# Patient Record
Sex: Female | Born: 1990
Health system: Southern US, Community
[De-identification: ages and names within clinical notes are randomized; demographics above are authoritative.]

## PROBLEM LIST (undated history)

## (undated) DIAGNOSIS — J45909 Unspecified asthma, uncomplicated: Secondary | ICD-10-CM

## (undated) DIAGNOSIS — F79 Unspecified intellectual disabilities: Secondary | ICD-10-CM

## (undated) HISTORY — DX: Unspecified intellectual disabilities: F79

## (undated) HISTORY — PX: WISDOM TOOTH EXTRACTION: SHX21

## (undated) HISTORY — DX: Unspecified asthma, uncomplicated: J45.909

## (undated) HISTORY — PX: STRABISMUS SURGERY: SHX218

---
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
## 2018-04-08 ENCOUNTER — Telehealth: Payer: Self-pay | Admitting: Internal Medicine

## 2018-04-08 NOTE — Telephone Encounter (Signed)
Copied from CRM [id number]. Topic: Appointment Scheduling - New Patient >> Apr 08, 2018 10:32 AM Burchel, Abbi R wrote: Pt's father is a pt of Dr Okey Dupre, and is requesting to have DR Okey Dupre as pt's PCP.  Pt is special needs and would like to see the same provider as her father. Please call pt's mother to schedule if approved  Pt's Mother:[phone]

## 2018-04-09 ENCOUNTER — Encounter: Payer: Self-pay | Admitting: Family Medicine

## 2018-04-09 ENCOUNTER — Ambulatory Visit (INDEPENDENT_AMBULATORY_CARE_PROVIDER_SITE_OTHER): Payer: Medicare HMO | Admitting: Family Medicine

## 2018-04-09 VITALS — BP 118/66 | HR 92 | Ht <= 58 in | Wt 122.0 lb

## 2018-04-09 DIAGNOSIS — M25511 Pain in right shoulder: Secondary | ICD-10-CM | POA: Diagnosis not present

## 2018-04-09 DIAGNOSIS — Z23 Encounter for immunization: Secondary | ICD-10-CM | POA: Diagnosis not present

## 2018-04-09 NOTE — Assessment & Plan Note (Signed)
Likely posture related with a closed chest. Normal clinical exam. US showed a possible subacromial bursitis.  - counseled on HEP  - counseled on supportive care - if no improvement would consider PT.

## 2018-04-09 NOTE — Progress Notes (Signed)
Kelly Washington - 27 y.o. female MRN [id number]  Date of birth: 12/28/1990  SUBJECTIVE:  Including CC & ROS.  Chief Complaint  Patient presents with  . Shoulder Pain    Kelly Washington is a 27 y.o. female that is presenting with right shoulder pain. Ongoing for four days. She noticed the pain after she was swinging. Pain is located at the anterior aspect. Denies tingling or numbness. Pain is worse with abduction. Denies prior surgeries or injuries. She has not taking anything for the pain.      Review of Systems  Constitutional: Negative for fever.  HENT: Negative for congestion.   Respiratory: Negative for cough.   Cardiovascular: Negative for chest pain.  Gastrointestinal: Negative for abdominal pain.  Musculoskeletal: Negative for gait problem.  Skin: Negative for color change.  Neurological: Negative for weakness.  Hematological: Negative for adenopathy.  Psychiatric/Behavioral: Negative for agitation.    HISTORY: Past Medical, Surgical, Social, and Family History Reviewed & Updated per EMR.   Pertinent Historical Findings include:  Past Medical History:  Diagnosis Date  . Asthma   . Intellectual disability     Past Surgical History:  Procedure Laterality Date  . STRABISMUS SURGERY    . WISDOM TOOTH EXTRACTION      No Known Allergies  History reviewed. No pertinent family history.   Social History   Socioeconomic History  . Marital status: Single    Spouse name: Not on file  . Number of children: Not on file  . Years of education: Not on file  . Highest education level: Not on file  Occupational History  . Not on file  Social Needs  . Financial resource strain: Not on file  . Food insecurity:    Worry: Not on file    Inability: Not on file  . Transportation needs:    Medical: Not on file    Non-medical: Not on file  Tobacco Use  . Smoking status: Never Smoker  . Smokeless tobacco: Never Used  Substance and Sexual Activity  . Alcohol use: Never     Frequency: Never  . Drug use: Never  . Sexual activity: Not on file  Lifestyle  . Physical activity:    Days per week: Not on file    Minutes per session: Not on file  . Stress: Not on file  Relationships  . Social connections:    Talks on phone: Not on file    Gets together: Not on file    Attends religious service: Not on file    Active member of club or organization: Not on file    Attends meetings of clubs or organizations: Not on file    Relationship status: Not on file  . Intimate partner violence:    Fear of current or ex partner: Not on file    Emotionally abused: Not on file    Physically abused: Not on file    Forced sexual activity: Not on file  Other Topics Concern  . Not on file  Social History Narrative  . Not on file     PHYSICAL EXAM:  VS: BP 118/66   Pulse 92   Ht 4\' 10"  (1.473 m)   Wt 122 lb (55.3 kg)   LMP  (LMP Unknown)   SpO2 99%   BMI 25.50 kg/m  Physical Exam Gen: NAD, alert, cooperative with exam, well-appearing ENT: normal lips, normal nasal mucosa,  Eye: normal EOM, normal conjunctiva and lids CV:  no edema, +2 pedal pulses  Resp: no accessory muscle use, non-labored,  Skin: no rashes, no areas of induration  Neuro: normal tone, normal sensation to touch Psych:  normal insight, alert and oriented MSK:  Right Shoulder: Inspection reveals no abnormalities, atrophy or asymmetry. Palpation is normal with no tenderness over AC joint  ROM is full in all planes. Rotator cuff strength normal throughout. negative  Hawkin's tests, empty can sign. Speeds tests normal. negative Obrien's No apprehension sign Neurovascularly intact   Limited ultrasound: right shoulder:  Normal appearing BT  Normal appearing subscap  Possible for small subacromial bursa. Normal appearing supraspinatus   Summary: possible for small subacromial bursitis otherwise normal   Ultrasound and interpretation by Clare Gandy, MD             ASSESSMENT & PLAN:   Acute pain of right shoulder Likely posture related with a closed chest. Normal clinical exam. US showed a possible subacromial bursitis.  - counseled on HEP  - counseled on supportive care - if no improvement would consider PT.

## 2018-04-09 NOTE — Patient Instructions (Signed)
Nice to meet you  Please try to work on the posture  Please try the medicine to rub on. If this works then I can send in more  Please try the exercises  Please follow up with me if your symptoms don't improve.

## 2018-04-14 NOTE — Telephone Encounter (Signed)
Fine

## 2018-04-15 NOTE — Telephone Encounter (Signed)
LVM informing family of response

## 2018-09-07 NOTE — Telephone Encounter (Signed)
Called mother back to schedule and left message for her to call back. Okay to schedule with Dr Jonny Ruiz. OFFICE VISIT NOTE: New Patient/establish care (okay per Dr Jonny Ruiz)  Please contact office once schedule so that visit type can be changed and new patient paperwork mailed.

## 2018-09-07 NOTE — Telephone Encounter (Signed)
Patient scheduled for 2/28 with Dr. Jonny Ruiz

## 2018-09-10 ENCOUNTER — Telehealth: Payer: Self-pay

## 2018-09-10 NOTE — Telephone Encounter (Signed)
I noted in error..  This has been corrected and appointment is scheduled with Dr Okey Dupre.

## 2018-09-10 NOTE — Telephone Encounter (Signed)
fyi

## 2018-09-10 NOTE — Telephone Encounter (Signed)
Copied from CRM [id number]. Topic: General - Other >> Sep 10, 2018 10:42 AM Wyonia Hough E wrote: Reason for CRM:  PT mom called to inform office that the PT is very sensative to her weight and the amount of hair on her body. She also has no reproductive parts which is the reason for the hair and there is no need for pap smears or mention of. Pt mom states that she has meltdowns and wants office to be aware of her sensitivity and she is special needs. Pt also does not like to undress

## 2018-09-11 ENCOUNTER — Ambulatory Visit (INDEPENDENT_AMBULATORY_CARE_PROVIDER_SITE_OTHER): Payer: Medicare HMO | Admitting: Internal Medicine

## 2018-09-11 ENCOUNTER — Encounter: Payer: Self-pay | Admitting: Internal Medicine

## 2018-09-11 ENCOUNTER — Ambulatory Visit: Payer: Medicare HMO | Admitting: Internal Medicine

## 2018-09-11 DIAGNOSIS — J4520 Mild intermittent asthma, uncomplicated: Secondary | ICD-10-CM | POA: Diagnosis not present

## 2018-09-11 DIAGNOSIS — F819 Developmental disorder of scholastic skills, unspecified: Secondary | ICD-10-CM | POA: Diagnosis not present

## 2018-09-11 DIAGNOSIS — J45909 Unspecified asthma, uncomplicated: Secondary | ICD-10-CM | POA: Insufficient documentation

## 2018-09-11 DIAGNOSIS — R69 Illness, unspecified: Secondary | ICD-10-CM | POA: Diagnosis not present

## 2018-09-11 MED ORDER — ALBUTEROL SULFATE HFA 108 (90 BASE) MCG/ACT IN AERS
2.0000 | INHALATION_SPRAY | Freq: Four times a day (QID) | RESPIRATORY_TRACT | 0 refills | Status: DC | PRN
Start: 2018-09-11 — End: 2018-10-15

## 2018-09-11 NOTE — Assessment & Plan Note (Signed)
Refer to endocrinology for continued follow up. Will need to get records for workup of hypocalcemia and it is unclear if this is related to congenital abnormalities.

## 2018-09-11 NOTE — Progress Notes (Signed)
   Subjective:   Patient ID: Kelly Washington, female    DOB: Nov 21, 1990, 28 y.o.   MRN: [id number]  HPI The patient is a 28 YO female coming in new for continuation of care and concerns about getting an endocrinologist in the area (has had problems in the past with low calcium levels with muscle spasms, saw endo in [place] and had workup and was advised just to take tums TID and she has not had bad cramps since that time, does miss sometimes), and asthma (in the past, no problems with breathing in some time, has a nebulizer but not used in some time, denies having albuterol inhaler currently, no allergy problems), and intellectual delay (unclear cause exactly both without uterus and cervix, microovary, does not drive, lives with parents, not working currently, wants to start some classes at [employer] now).   PMH, [facility], social history reviewed and updated  Review of Systems  Constitutional: Negative.   HENT: Negative.   Eyes: Negative.   Respiratory: Negative for cough, chest tightness and shortness of breath.   Cardiovascular: Negative for chest pain, palpitations and leg swelling.  Gastrointestinal: Negative for abdominal distention, abdominal pain, constipation, diarrhea, nausea and vomiting.  Musculoskeletal: Negative.   Skin: Negative.   Neurological: Negative.   Psychiatric/Behavioral: Negative.     Objective:  Physical Exam Constitutional:      Appearance: She is well-developed.  HENT:     Head: Normocephalic and atraumatic.  Neck:     Musculoskeletal: Normal range of motion.  Cardiovascular:     Rate and Rhythm: Normal rate and regular rhythm.  Pulmonary:     Effort: Pulmonary effort is normal. No respiratory distress.     Breath sounds: Normal breath sounds. No wheezing or rales.  Abdominal:     General: Bowel sounds are normal. There is no distension.     Palpations: Abdomen is soft.     Tenderness: There is no abdominal tenderness. There is no rebound.   Skin:    General: Skin is warm and dry.  Neurological:     Mental Status: She is alert and oriented to person, place, and time.     Coordination: Coordination normal.     Vitals:   09/11/18 1043  BP: 120/76  Pulse: (!) 111  Temp: 97.9 F (36.6 C)  TempSrc: Oral  SpO2: 99%  Height: 4\' 10"  (1.473 m)    Assessment & Plan:

## 2018-09-11 NOTE — Addendum Note (Signed)
Addended by: Hillard Danker A on: 09/11/2018 12:24 PM   Modules accepted: Orders

## 2018-09-11 NOTE — Assessment & Plan Note (Signed)
Rx for albuterol inhaler. May have outgrown as no problems with this in some years.

## 2018-09-11 NOTE — Assessment & Plan Note (Signed)
Unclear etiology at this time. Will try to obtain records from prior provider. Could be turner syndrome with short stature and micro-ovary. Also has congenital lack of uterus and cervix.

## 2018-09-11 NOTE — Patient Instructions (Signed)
Come back in 1-2 years just to check in.   Health Maintenance, Female Adopting a healthy lifestyle and getting preventive care can go a long way to promote health and wellness. Talk with your health care provider about what schedule of regular examinations is right for you. This is a good chance for you to check in with your provider about disease prevention and staying healthy. In between checkups, there are plenty of things you can do on your own. Experts have done a lot of research about which lifestyle changes and preventive measures are most likely to keep you healthy. Ask your health care provider for more information. Weight and diet Eat a healthy diet  Be sure to include plenty of vegetables, fruits, low-fat dairy products, and lean protein.  Do not eat a lot of foods high in solid fats, added sugars, or salt.  Get regular exercise. This is one of the most important things you can do for your health. ? Most adults should exercise for at least 150 minutes each week. The exercise should increase your heart rate and make you sweat (moderate-intensity exercise). ? Most adults should also do strengthening exercises at least twice a week. This is in addition to the moderate-intensity exercise. Maintain a healthy weight  Body mass index (BMI) is a measurement that can be used to identify possible weight problems. It estimates body fat based on height and weight. Your health care provider can help determine your BMI and help you achieve or maintain a healthy weight.  For females 60 years of age and older: ? A BMI below 18.5 is considered underweight. ? A BMI of 18.5 to 24.9 is normal. ? A BMI of 25 to 29.9 is considered overweight. ? A BMI of 30 and above is considered obese. Watch levels of cholesterol and blood lipids  You should start having your blood tested for lipids and cholesterol at 28 years of age, then have this test every 5 years.  You may need to have your cholesterol levels  checked more often if: ? Your lipid or cholesterol levels are high. ? You are older than 28 years of age. ? You are at high risk for heart disease. Cancer screening Lung Cancer  Lung cancer screening is recommended for adults 6-8 years old who are at high risk for lung cancer because of a history of smoking.  A yearly low-dose CT scan of the lungs is recommended for people who: ? Currently smoke. ? Have quit within the past 15 years. ? Have at least a 30-pack-year history of smoking. A pack year is smoking an average of one pack of cigarettes a day for 1 year.  Yearly screening should continue until it has been 15 years since you quit.  Yearly screening should stop if you develop a health problem that would prevent you from having lung cancer treatment. Breast Cancer  Practice breast self-awareness. This means understanding how your breasts normally appear and feel.  It also means doing regular breast self-exams. Let your health care provider know about any changes, no matter how small.  If you are in your 20s or 30s, you should have a clinical breast exam (CBE) by a health care provider every 1-3 years as part of a regular health exam.  If you are 13 or older, have a CBE every year. Also consider having a breast X-ray (mammogram) every year.  If you have a family history of breast cancer, talk to your health care provider about genetic screening.  If you are at high risk for breast cancer, talk to your health care provider about having an MRI and a mammogram every year.  Breast cancer gene (BRCA) assessment is recommended for women who have family members with BRCA-related cancers. BRCA-related cancers include: ? Breast. ? Ovarian. ? Tubal. ? Peritoneal cancers.  Results of the assessment will determine the need for genetic counseling and BRCA1 and BRCA2 testing. Cervical Cancer Your health care provider may recommend that you be screened regularly for cancer of the pelvic  organs (ovaries, uterus, and vagina). This screening involves a pelvic examination, including checking for microscopic changes to the surface of your cervix (Pap test). You may be encouraged to have this screening done every 3 years, beginning at age 43.  For women ages 34-65, health care providers may recommend pelvic exams and Pap testing every 3 years, or they may recommend the Pap and pelvic exam, combined with testing for human papilloma virus (HPV), every 5 years. Some types of HPV increase your risk of cervical cancer. Testing for HPV may also be done on women of any age with unclear Pap test results.  Other health care providers may not recommend any screening for nonpregnant women who are considered low risk for pelvic cancer and who do not have symptoms. Ask your health care provider if a screening pelvic exam is right for you.  If you have had past treatment for cervical cancer or a condition that could lead to cancer, you need Pap tests and screening for cancer for at least 20 years after your treatment. If Pap tests have been discontinued, your risk factors (such as having a new sexual partner) need to be reassessed to determine if screening should resume. Some women have medical problems that increase the chance of getting cervical cancer. In these cases, your health care provider may recommend more frequent screening and Pap tests. Colorectal Cancer  This type of cancer can be detected and often prevented.  Routine colorectal cancer screening usually begins at 28 years of age and continues through 28 years of age.  Your health care provider may recommend screening at an earlier age if you have risk factors for colon cancer.  Your health care provider may also recommend using home test kits to check for hidden blood in the stool.  A small camera at the end of a tube can be used to examine your colon directly (sigmoidoscopy or colonoscopy). This is done to check for the earliest forms  of colorectal cancer.  Routine screening usually begins at age 74.  Direct examination of the colon should be repeated every 5-10 years through 28 years of age. However, you may need to be screened more often if early forms of precancerous polyps or small growths are found. Skin Cancer  Check your skin from head to toe regularly.  Tell your health care provider about any new moles or changes in moles, especially if there is a change in a mole's shape or color.  Also tell your health care provider if you have a mole that is larger than the size of a pencil eraser.  Always use sunscreen. Apply sunscreen liberally and repeatedly throughout the day.  Protect yourself by wearing long sleeves, pants, a wide-brimmed hat, and sunglasses whenever you are outside. Heart disease, diabetes, and high blood pressure  High blood pressure causes heart disease and increases the risk of stroke. High blood pressure is more likely to develop in: ? People who have blood pressure in the high  end of the normal range (130-139/85-89 mm Hg). ? People who are overweight or obese. ? People who are African American.  If you are 17-7 years of age, have your blood pressure checked every 3-5 years. If you are 82 years of age or older, have your blood pressure checked every year. You should have your blood pressure measured twice-once when you are at a hospital or clinic, and once when you are not at a hospital or clinic. Record the average of the two measurements. To check your blood pressure when you are not at a hospital or clinic, you can use: ? An automated blood pressure machine at a pharmacy. ? A home blood pressure monitor.  If you are between 33 years and 4 years old, ask your health care provider if you should take aspirin to prevent strokes.  Have regular diabetes screenings. This involves taking a blood sample to check your fasting blood sugar level. ? If you are at a normal weight and have a low risk for  diabetes, have this test once every three years after 28 years of age. ? If you are overweight and have a high risk for diabetes, consider being tested at a younger age or more often. Preventing infection Hepatitis B  If you have a higher risk for hepatitis B, you should be screened for this virus. You are considered at high risk for hepatitis B if: ? You were born in a country where hepatitis B is common. Ask your health care provider which countries are considered high risk. ? Your parents were born in a high-risk country, and you have not been immunized against hepatitis B (hepatitis B vaccine). ? You have HIV or AIDS. ? You use needles to inject street drugs. ? You live with someone who has hepatitis B. ? You have had sex with someone who has hepatitis B. ? You get hemodialysis treatment. ? You take certain medicines for conditions, including cancer, organ transplantation, and autoimmune conditions. Hepatitis C  Blood testing is recommended for: ? Everyone born from [birth year] through 1965. ? Anyone with known risk factors for hepatitis C. Sexually transmitted infections (STIs)  You should be screened for sexually transmitted infections (STIs) including gonorrhea and chlamydia if: ? You are sexually active and are younger than 28 years of age. ? You are older than 28 years of age and your health care provider tells you that you are at risk for this type of infection. ? Your sexual activity has changed since you were last screened and you are at an increased risk for chlamydia or gonorrhea. Ask your health care provider if you are at risk.  If you do not have HIV, but are at risk, it may be recommended that you take a prescription medicine daily to prevent HIV infection. This is called pre-exposure prophylaxis (PrEP). You are considered at risk if: ? You are sexually active and do not regularly use condoms or know the HIV status of your partner(s). ? You take drugs by injection. ? You are  sexually active with a partner who has HIV. Talk with your health care provider about whether you are at high risk of being infected with HIV. If you choose to begin PrEP, you should first be tested for HIV. You should then be tested every 3 months for as long as you are taking PrEP. Pregnancy  If you are premenopausal and you may become pregnant, ask your health care provider about preconception counseling.  If you may become pregnant,  take 400 to 800 micrograms (mcg) of folic acid every day.  If you want to prevent pregnancy, talk to your health care provider about birth control (contraception). Osteoporosis and menopause  Osteoporosis is a disease in which the bones lose minerals and strength with aging. This can result in serious bone fractures. Your risk for osteoporosis can be identified using a bone density scan.  If you are 46 years of age or older, or if you are at risk for osteoporosis and fractures, ask your health care provider if you should be screened.  Ask your health care provider whether you should take a calcium or vitamin D supplement to lower your risk for osteoporosis.  Menopause may have certain physical symptoms and risks.  Hormone replacement therapy may reduce some of these symptoms and risks. Talk to your health care provider about whether hormone replacement therapy is right for you. Follow these instructions at home:  Schedule regular health, dental, and eye exams.  Stay current with your immunizations.  Do not use any tobacco products including cigarettes, chewing tobacco, or electronic cigarettes.  If you are pregnant, do not drink alcohol.  If you are breastfeeding, limit how much and how often you drink alcohol.  Limit alcohol intake to no more than 1 drink per day for nonpregnant women. One drink equals 12 ounces of beer, 5 ounces of wine, or 1 ounces of hard liquor.  Do not use street drugs.  Do not share needles.  Ask your health care  provider for help if you need support or information about quitting drugs.  Tell your health care provider if you often feel depressed.  Tell your health care provider if you have ever been abused or do not feel safe at home. This information is not intended to replace advice given to you by your health care provider. Make sure you discuss any questions you have with your health care provider. Document Released: 01/14/2011 Document Revised: 12/07/2015 Document Reviewed: 04/04/2015 Elsevier Interactive Patient Education  2019 [employer].

## 2018-09-28 ENCOUNTER — Ambulatory Visit: Payer: Medicare HMO | Admitting: Internal Medicine

## 2018-10-15 ENCOUNTER — Other Ambulatory Visit: Payer: Self-pay | Admitting: Internal Medicine

## 2021-02-06 DIAGNOSIS — K219 Gastro-esophageal reflux disease without esophagitis: Secondary | ICD-10-CM | POA: Diagnosis not present

## 2021-02-06 DIAGNOSIS — Z833 Family history of diabetes mellitus: Secondary | ICD-10-CM | POA: Diagnosis not present

## 2021-02-06 DIAGNOSIS — Z803 Family history of malignant neoplasm of breast: Secondary | ICD-10-CM | POA: Diagnosis not present

## 2021-02-06 DIAGNOSIS — R030 Elevated blood-pressure reading, without diagnosis of hypertension: Secondary | ICD-10-CM | POA: Diagnosis not present

## 2021-02-06 DIAGNOSIS — Z8249 Family history of ischemic heart disease and other diseases of the circulatory system: Secondary | ICD-10-CM | POA: Diagnosis not present

## 2021-03-05 ENCOUNTER — Ambulatory Visit (INDEPENDENT_AMBULATORY_CARE_PROVIDER_SITE_OTHER): Payer: Medicare HMO | Admitting: Internal Medicine

## 2021-03-05 ENCOUNTER — Encounter: Payer: Self-pay | Admitting: Internal Medicine

## 2021-03-05 ENCOUNTER — Other Ambulatory Visit: Payer: Self-pay

## 2021-03-05 VITALS — BP 130/80 | HR 103 | Temp 98.1°F | Resp 18 | Ht <= 58 in | Wt 145.0 lb

## 2021-03-05 DIAGNOSIS — Z0000 Encounter for general adult medical examination without abnormal findings: Secondary | ICD-10-CM

## 2021-03-05 DIAGNOSIS — R69 Illness, unspecified: Secondary | ICD-10-CM | POA: Diagnosis not present

## 2021-03-05 DIAGNOSIS — F819 Developmental disorder of scholastic skills, unspecified: Secondary | ICD-10-CM

## 2021-03-05 DIAGNOSIS — J4520 Mild intermittent asthma, uncomplicated: Secondary | ICD-10-CM

## 2021-03-05 MED ORDER — ALBUTEROL SULFATE HFA 108 (90 BASE) MCG/ACT IN AERS: 1.0000 | INHALATION_SPRAY | RESPIRATORY_TRACT | 0 refills | Status: DC | PRN

## 2021-03-05 MED ORDER — ALBUTEROL SULFATE (2.5 MG/3ML) 0.083% IN NEBU: 2.5000 mg | INHALATION_SOLUTION | Freq: Four times a day (QID) | RESPIRATORY_TRACT | 1 refills | Status: AC | PRN

## 2021-03-05 NOTE — Patient Instructions (Signed)
Work on adding some exercise regularly.

## 2021-03-05 NOTE — Progress Notes (Signed)
Subjective:   Patient ID: Kelly Washington, female    DOB: [birth year], 30 y.o.   MRN: [id number]  HPI Here for medicare wellness and physical, no new complaints. Please see A/P for status and treatment of chronic medical problems.   Diet: heart healthy  Physical activity: sedentary Depression/mood screen: negative Hearing: intact to whispered voice Visual acuity: grossly normal, performs annual eye exam  ADLs: capable, some assist with IADLs Fall risk: none Home safety: good Cognitive evaluation: intact to orientation, naming, recall and repetition EOL planning: adv directives discussed, not in place  [employer] Visit from 03/05/2021 in [facility] at [facility] Total Score 0        No flowsheet data found.  I have personally reviewed and have noted 1. The patient's medical and social history - reviewed today no changes 2. Their use of alcohol, tobacco or illicit drugs 3. Their current medications and supplements 4. The patient's functional ability including ADL's, fall risks, home safety risks and hearing or visual impairment. 5. Diet and physical activities 6. Evidence for depression or mood disorders 7. Care team reviewed and updated 8.  The patient is not on an opioid pain medication.  Patient Care Team: Myrlene Broker, MD as PCP - General (Internal Medicine) Past Medical History:  Diagnosis Date   Asthma    Intellectual disability    Past Surgical History:  Procedure Laterality Date   STRABISMUS SURGERY     WISDOM TOOTH EXTRACTION     Family History  Problem Relation Age of Onset   Arthritis Mother    Cancer Mother    Hyperlipidemia Mother    Hypertension Mother    Miscarriages / [place] Mother    Arthritis Father    Diabetes Father    Depression Brother    Arthritis Maternal Grandmother    Cancer Maternal Grandmother    Heart disease Maternal Grandmother    Stroke Maternal Grandmother    Heart disease Maternal  Grandfather    Arthritis Maternal Grandfather    Hearing loss Maternal Grandfather      Review of Systems  Constitutional: Negative.   HENT: Negative.    Eyes: Negative.   Respiratory:  Negative for cough, chest tightness and shortness of breath.   Cardiovascular:  Negative for chest pain, palpitations and leg swelling.  Gastrointestinal:  Negative for abdominal distention, abdominal pain, constipation, diarrhea, nausea and vomiting.  Musculoskeletal: Negative.   Skin: Negative.   Neurological: Negative.   Psychiatric/Behavioral: Negative.     Objective:  Physical Exam Constitutional:      Appearance: She is well-developed.  HENT:     Head: Normocephalic and atraumatic.  Cardiovascular:     Rate and Rhythm: Normal rate and regular rhythm.  Pulmonary:     Effort: Pulmonary effort is normal. No respiratory distress.     Breath sounds: Normal breath sounds. No wheezing or rales.  Abdominal:     General: Bowel sounds are normal. There is no distension.     Palpations: Abdomen is soft.     Tenderness: There is no abdominal tenderness. There is no rebound.  Musculoskeletal:     Cervical back: Normal range of motion.  Skin:    General: Skin is warm and dry.  Neurological:     Mental Status: She is alert and oriented to person, place, and time.     Coordination: Coordination normal.    Vitals:   03/05/21 1410  BP: 130/80  Pulse: (!) 103  Resp: 18  Temp: 98.1 F (36.7 C)  TempSrc: Oral  SpO2: 98%  Weight: 145 lb (65.8 kg)  Height: 4\' 10"  (1.473 m)    This visit occurred during the SARS-CoV-2 public health emergency.  Safety protocols were in place, including screening questions prior to the visit, additional usage of staff PPE, and extensive cleaning of exam room while observing appropriate contact time as indicated for disinfecting solutions.   Assessment & Plan:

## 2021-03-07 DIAGNOSIS — Z0000 Encounter for general adult medical examination without abnormal findings: Secondary | ICD-10-CM | POA: Insufficient documentation

## 2021-03-07 NOTE — Assessment & Plan Note (Signed)
Uses albuterol prn and needs refill due to expired device. No flare today.

## 2021-03-07 NOTE — Assessment & Plan Note (Signed)
Flu shot yearly. Covid-19 up to date counseled. Tetanus due advised to get at pharmacy. Pap smear not indicated. Counseled about sun safety and mole surveillance. Counseled about the dangers of distracted driving. Given 10 year screening recommendations.

## 2021-03-07 NOTE — Assessment & Plan Note (Signed)
Stable, lives at home with parents. No job currently and not doing school currently.

## 2021-03-07 NOTE — Assessment & Plan Note (Signed)
Takes tums TID which has helped her hand cramps to stay away. Continue this.

## 2021-08-26 ENCOUNTER — Other Ambulatory Visit: Payer: Self-pay | Admitting: Internal Medicine

## 2021-08-26 ENCOUNTER — Other Ambulatory Visit: Payer: Self-pay

## 2021-08-26 ENCOUNTER — Emergency Department (HOSPITAL_BASED_OUTPATIENT_CLINIC_OR_DEPARTMENT_OTHER)
Admission: EM | Admit: 2021-08-26 | Discharge: 2021-08-26 | Disposition: A | Discharge status: Home / Self Care | Payer: Medicare HMO | Attending: Emergency Medicine | Admitting: Emergency Medicine

## 2021-08-26 ENCOUNTER — Encounter (HOSPITAL_BASED_OUTPATIENT_CLINIC_OR_DEPARTMENT_OTHER): Payer: Self-pay

## 2021-08-26 ENCOUNTER — Emergency Department (HOSPITAL_BASED_OUTPATIENT_CLINIC_OR_DEPARTMENT_OTHER): Payer: Medicare HMO

## 2021-08-26 DIAGNOSIS — R059 Cough, unspecified: Secondary | ICD-10-CM | POA: Diagnosis not present

## 2021-08-26 DIAGNOSIS — Z20822 Contact with and (suspected) exposure to covid-19: Secondary | ICD-10-CM | POA: Insufficient documentation

## 2021-08-26 DIAGNOSIS — E876 Hypokalemia: Secondary | ICD-10-CM | POA: Diagnosis not present

## 2021-08-26 DIAGNOSIS — J45909 Unspecified asthma, uncomplicated: Secondary | ICD-10-CM | POA: Diagnosis not present

## 2021-08-26 DIAGNOSIS — J4531 Mild persistent asthma with (acute) exacerbation: Secondary | ICD-10-CM

## 2021-08-26 DIAGNOSIS — R000 Tachycardia, unspecified: Secondary | ICD-10-CM | POA: Diagnosis not present

## 2021-08-26 DIAGNOSIS — R058 Other specified cough: Secondary | ICD-10-CM

## 2021-08-26 DIAGNOSIS — J4541 Moderate persistent asthma with (acute) exacerbation: Secondary | ICD-10-CM | POA: Diagnosis not present

## 2021-08-26 LAB — COMPREHENSIVE METABOLIC PANEL
ALT: 13 U/L (ref 0–44)
AST: 11 U/L — ABNORMAL LOW (ref 15–41)
Albumin: 4.5 g/dL (ref 3.5–5.0)
Alkaline Phosphatase: 39 U/L (ref 38–126)
Anion gap: 11 (ref 5–15)
BUN: 14 mg/dL (ref 6–20)
CO2: 24 mmol/L (ref 22–32)
Calcium: 9.4 mg/dL (ref 8.9–10.3)
Chloride: 103 mmol/L (ref 98–111)
Creatinine, Ser: 0.84 mg/dL (ref 0.44–1.00)
GFR, Estimated: >60 mL/min (ref >60)
Glucose, Bld: 156 mg/dL — ABNORMAL HIGH (ref 70–99)
Potassium: 3.4 mmol/L — ABNORMAL LOW (ref 3.5–5.1)
Sodium: 138 mmol/L (ref 135–145)
Total Bilirubin: 0.2 mg/dL — ABNORMAL LOW (ref 0.3–1.2)
Total Protein: 7 g/dL (ref 6.5–8.1)

## 2021-08-26 LAB — CBC
HCT: 41 % (ref 36.0–46.0)
Hemoglobin: 13.5 g/dL (ref 12.0–15.0)
MCH: 28 pg (ref 26.0–34.0)
MCHC: 32.9 g/dL (ref 30.0–36.0)
MCV: 85.1 fL (ref 80.0–100.0)
Platelets: 195 10*3/uL (ref 150–400)
RBC: 4.82 MIL/uL (ref 3.87–5.11)
RDW: 13.2 % (ref 11.5–15.5)
WBC: 7.3 10*3/uL (ref 4.0–10.5)
nRBC: 0 % (ref 0.0–0.2)

## 2021-08-26 LAB — RESP PANEL BY RT-PCR (FLU A&B, COVID) ARPGX2
Influenza A by PCR: NEGATIVE
Influenza B by PCR: NEGATIVE
SARS Coronavirus 2 by RT PCR: NEGATIVE

## 2021-08-26 LAB — TSH: TSH: 4.935 u[IU]/mL — ABNORMAL HIGH (ref 0.350–4.500)

## 2021-08-26 LAB — T4, FREE: Free T4: 0.7 ng/dL (ref 0.61–1.12)

## 2021-08-26 MED ORDER — ALBUTEROL SULFATE (2.5 MG/3ML) 0.083% IN NEBU
2.5000 mg | INHALATION_SOLUTION | Freq: Once | RESPIRATORY_TRACT | Status: AC
  Administered 2021-08-26: 2.5 mg via RESPIRATORY_TRACT
  Filled 2021-08-26: qty 3

## 2021-08-26 MED ORDER — ALBUTEROL SULFATE (2.5 MG/3ML) 0.083% IN NEBU: 2.5000 mg | INHALATION_SOLUTION | Freq: Four times a day (QID) | RESPIRATORY_TRACT | 1 refills | Status: AC | PRN

## 2021-08-26 MED ORDER — POTASSIUM CHLORIDE CRYS ER 20 MEQ PO TBCR
40.0000 meq | EXTENDED_RELEASE_TABLET | Freq: Once | ORAL | Status: DC
  Filled 2021-08-26: qty 2

## 2021-08-26 MED ORDER — ALBUTEROL SULFATE HFA 108 (90 BASE) MCG/ACT IN AERS
INHALATION_SPRAY | RESPIRATORY_TRACT | Status: AC
  Administered 2021-08-26: 2 via RESPIRATORY_TRACT
  Filled 2021-08-26: qty 6.7

## 2021-08-26 MED ORDER — IPRATROPIUM-ALBUTEROL 0.5-2.5 (3) MG/3ML IN SOLN
3.0000 mL | Freq: Once | RESPIRATORY_TRACT | Status: AC
  Administered 2021-08-26: 3 mL via RESPIRATORY_TRACT
  Filled 2021-08-26: qty 3

## 2021-08-26 MED ORDER — ALBUTEROL SULFATE (2.5 MG/3ML) 0.083% IN NEBU: 5.0000 mg | INHALATION_SOLUTION | Freq: Once | RESPIRATORY_TRACT | Status: DC

## 2021-08-26 MED ORDER — AEROCHAMBER PLUS FLO-VU MISC
1.0000 | Freq: Once | Status: AC
  Administered 2021-08-26: 1
  Filled 2021-08-26: qty 1

## 2021-08-26 MED ORDER — ALBUTEROL SULFATE HFA 108 (90 BASE) MCG/ACT IN AERS: 2.0000 | INHALATION_SPRAY | Freq: Four times a day (QID) | RESPIRATORY_TRACT | Status: DC

## 2021-08-26 MED ORDER — POTASSIUM CHLORIDE 20 MEQ PO PACK
20.0000 meq | PACK | Freq: Once | ORAL | Status: AC
  Administered 2021-08-26: 20 meq via ORAL
  Filled 2021-08-26: qty 1

## 2021-08-26 MED ORDER — IPRATROPIUM BROMIDE 0.02 % IN SOLN: 0.5000 mg | Freq: Once | RESPIRATORY_TRACT | Status: DC

## 2021-08-26 MED ORDER — PREDNISOLONE 15 MG/5ML PO SOLN: 45.0000 mg | Freq: Every day | ORAL | 0 refills | Status: AC

## 2021-08-26 MED ORDER — PREDNISONE 50 MG PO TABS
60.0000 mg | ORAL_TABLET | Freq: Once | ORAL | Status: AC
Start: 2021-08-26 — End: 2021-08-26
  Administered 2021-08-26: 60 mg via ORAL
  Filled 2021-08-26: qty 1

## 2021-08-26 NOTE — Discharge Instructions (Addendum)
It was our pleasure to provide your ER care today - we hope that you feel better.  Take orapred as prescribed. Use albuterol treatment as need.   From today's labs, your potassium level is slightly low - eat plenty of fruits and vegetables, and follow up with primary care doctor.   Follow up with primary care doctor in one week.  Return to ER if worse, new symptoms, increased trouble breathing, or other concern.   Your thyroid tests remain pending and should be resulted by tomorrow - you may call for results or check MyChart (and your doctor can f/u on results).

## 2021-08-26 NOTE — ED Triage Notes (Signed)
Patient here POV from Home with Cough.  Caregivers endorse Patient has Asthma and has been using Nebulizer Treatments more frequently the past few months.  Cough is Dry.   NAD noted during Triage. Actively Coughing.

## 2021-08-26 NOTE — ED Notes (Signed)
Discharge instructions discussed with caregivers at bedside. Pt/caregivers verbalized understanding with no questions at this time.

## 2021-08-26 NOTE — ED Provider Notes (Signed)
[facility] EMERGENCY DEPT Provider Note   CSN: [id number] Arrival date & time: 08/26/21  1731     History  Chief Complaint  Patient presents with   Cough   Asthma    Kelly Washington is a 31 y.o. female.  Pt with hx asthma, presents with increased non prod cough in the past few days. Caregivers note more neb txs that baseline over course of past few months.   No hx admission for asthma, no recent steroid use. No known ill contacts or known covid or flu exposure. Cough is non productive. No sore throat or runny nose. No chest pain. No sob. No abd pain or vomiting. No extremity swelling or pain. No recent new meds/change in meds. No wt change. No sweats or fevers.  Pt did have two neb txs today pta.   The history is provided by the patient, a caregiver and medical records. The history is limited by the condition of the patient.      Home Medications Prior to Admission medications   Medication Sig Start Date End Date Taking? Authorizing Provider  albuterol (PROVENTIL) (2.5 MG/3ML) 0.083% nebulizer solution Take 3 mLs (2.5 mg total) by nebulization every 6 (six) hours as needed for wheezing or shortness of breath. 03/05/21   Myrlene Broker, MD  albuterol (VENTOLIN HFA) 108 (90 Base) MCG/ACT inhaler Inhale 1-2 puffs into the lungs every 4 (four) hours as needed for wheezing or shortness of breath. 03/05/21   Myrlene Broker, MD  calcium-vitamin D (OSCAL WITH D) 250-125 MG-UNIT tablet Take 1 tablet by mouth daily.    [provider]      Allergies    Patient has no known allergies.    Review of Systems   Review of Systems  Constitutional:  Negative for chills, diaphoresis, fever and unexpected weight change.  HENT:  Negative for sore throat and trouble swallowing.   Eyes:  Negative for redness.  Respiratory:  Positive for cough. Negative for shortness of breath.   Cardiovascular:  Negative for chest pain and leg swelling.  Gastrointestinal:   Negative for abdominal pain, diarrhea and vomiting.  Genitourinary:  Negative for dysuria and flank pain.  Musculoskeletal:  Negative for back pain and neck pain.  Skin:  Negative for rash.  Neurological:  Negative for headaches.  Hematological:  Does not bruise/bleed easily.  Psychiatric/Behavioral:  Negative for behavioral problems.    Physical Exam Updated Vital Signs BP (!) 129/99    Pulse (!) 114    Temp 98.2 F (36.8 C) (Oral)    Resp (!) 27    Ht 1.473 m (4\' 10" )    Wt 65.8 kg    SpO2 97%    BMI 30.32 kg/m  Physical Exam Vitals and nursing note reviewed.  Constitutional:      Appearance: Normal appearance. She is well-developed.  HENT:     Head: Atraumatic.     Nose: Nose normal.     Mouth/Throat:     Mouth: Mucous membranes are moist.  Eyes:     General: No scleral icterus.    Conjunctiva/sclera: Conjunctivae normal.     Pupils: Pupils are equal, round, and reactive to light.  Neck:     Trachea: No tracheal deviation.     Comments: Thyroid not grossly enlarged or tender. Trachea midline.  Cardiovascular:     Rate and Rhythm: Regular rhythm. Tachycardia present.     Pulses: Normal pulses.     Heart sounds: Normal heart sounds. No  murmur heard.   No friction rub. No gallop.  Pulmonary:     Effort: Pulmonary effort is normal. No respiratory distress.     Breath sounds: Wheezing present.     Comments: +non-prod cough.  Abdominal:     General: Bowel sounds are normal. There is no distension.     Palpations: Abdomen is soft. There is no mass.     Tenderness: There is no abdominal tenderness.  Genitourinary:    Comments: No cva tenderness.  Musculoskeletal:        General: No swelling or tenderness.     Cervical back: Normal range of motion and neck supple. No rigidity. No muscular tenderness.     Right lower leg: No edema.     Left lower leg: No edema.  Skin:    General: Skin is warm and dry.     Findings: No rash.  Neurological:     Mental Status: She is alert.      Comments: Alert, speech normal, no dysarthria. Very pleasant, smiling, responds to questions. Motor/sens grossly intact bil.   Psychiatric:        Mood and Affect: Mood normal.    ED Results / Procedures / Treatments   Labs (all labs ordered are listed, but only abnormal results are displayed) Results for orders placed or performed during the hospital encounter of 08/26/21  Resp Panel by RT-PCR (Flu A&B, Covid) Nasopharyngeal Swab   Specimen: Nasopharyngeal Swab; Nasopharyngeal(NP) swabs in vial transport medium  Result Value Ref Range   SARS Coronavirus 2 by RT PCR NEGATIVE NEGATIVE   Influenza A by PCR NEGATIVE NEGATIVE   Influenza B by PCR NEGATIVE NEGATIVE  Comprehensive metabolic panel  Result Value Ref Range   Sodium 138 135 - 145 mmol/L   Potassium 3.4 (L) 3.5 - 5.1 mmol/L   Chloride 103 98 - 111 mmol/L   CO2 24 22 - 32 mmol/L   Glucose, Bld 156 (H) 70 - 99 mg/dL   BUN 14 6 - 20 mg/dL   Creatinine, Ser 4.97 0.44 - 1.00 mg/dL   Calcium 9.4 8.9 - 02.6 mg/dL   Total Protein 7.0 6.5 - 8.1 g/dL   Albumin 4.5 3.5 - 5.0 g/dL   AST 11 (L) 15 - 41 U/L   ALT 13 0 - 44 U/L   Alkaline Phosphatase 39 38 - 126 U/L   Total Bilirubin 0.2 (L) 0.3 - 1.2 mg/dL   GFR, Estimated >37 >85 mL/min   Anion gap 11 5 - 15  CBC  Result Value Ref Range   WBC 7.3 4.0 - 10.5 K/uL   RBC 4.82 3.87 - 5.11 MIL/uL   Hemoglobin 13.5 12.0 - 15.0 g/dL   HCT 88.5 02.7 - 74.1 %   MCV 85.1 80.0 - 100.0 fL   MCH 28.0 26.0 - 34.0 pg   MCHC 32.9 30.0 - 36.0 g/dL   RDW 28.7 86.7 - 67.2 %   Platelets 195 150 - 400 K/uL   nRBC 0.0 0.0 - 0.2 %   DG Chest Port 1 View  Result Date: 08/26/2021 CLINICAL DATA:  Cough. EXAM: PORTABLE CHEST 1 VIEW COMPARISON:  None. FINDINGS: Normal heart, mediastinum and hila. Clear lungs.  No pleural effusion or pneumothorax. Skeletal structures are grossly intact. IMPRESSION: No active disease. Electronically Signed   By: Amie Portland M.D.   On: 08/26/2021 18:16    EKG EKG  Interpretation  Date/Time:  Sunday August 26 2021 18:20:11 EST Ventricular Rate:  131 PR Interval:  127 QRS Duration: 77 QT Interval:  289 QTC Calculation: 427 R Axis:   69 Text Interpretation: Sinus tachycardia No previous tracing Confirmed by Cathren Laine ([id number]) on 08/26/2021 6:21:47 PM  Radiology DG Chest Port 1 View  Result Date: 08/26/2021 CLINICAL DATA:  Cough. EXAM: PORTABLE CHEST 1 VIEW COMPARISON:  None. FINDINGS: Normal heart, mediastinum and hila. Clear lungs.  No pleural effusion or pneumothorax. Skeletal structures are grossly intact. IMPRESSION: No active disease. Electronically Signed   By: Amie Portland M.D.   On: 08/26/2021 18:16    Procedures Procedures    Medications Ordered in ED Medications  predniSONE (DELTASONE) tablet 60 mg (has no administration in time range)  potassium chloride SA (KLOR-CON M) CR tablet 40 mEq (has no administration in time range)  albuterol (PROVENTIL) (2.5 MG/3ML) 0.083% nebulizer solution 5 mg (has no administration in time range)  ipratropium (ATROVENT) nebulizer solution 0.5 mg (has no administration in time range)    ED Course/ Medical Decision Making/ A&P                           Medical Decision Making Problems Addressed: Hypokalemia: acute illness or injury    Details: mild Mild persistent asthma with exacerbation: acute illness or injury with systemic symptoms Nonproductive cough: acute illness or injury  Amount and/or Complexity of Data Reviewed Independent Historian: caregiver External Data Reviewed: notes. Labs: ordered. Decision-making details documented in ED Course. Radiology: ordered and independent interpretation performed. Decision-making details documented in ED Course. ECG/medicine tests: ordered and independent interpretation performed. Decision-making details documented in ED Course.  Risk Prescription drug management. Decision regarding hospitalization.   Continuous pulse ox and cardiac  monitoring. Stat labs and imaging. Oral steroid, labs and imaging. Considered disposition including possible need for admission given asthma/wheezing - will give tx and reassess.   Reviewed nursing notes and prior charts for additional history.  Additional hx from caregivers.   Labs reviewed/interpreted by me - covid neg. Hgb normal. Wbc normal.   CXR reviewed/interpreted by me - no pna.   Cardiac monitor: sinus tachycardia, hr 130.   Recheck hr 110.   Albuterol and atrovent neb. Prednisone po. Kcl po.  Recheck, breathing comfortably, good air movement, no acute distress. Sats 99%.           Final Clinical Impression(s) / ED Diagnoses Final diagnoses:  None    Rx / DC Orders ED Discharge Orders     None         Cathren Laine, MD 08/26/21 2027

## 2021-11-04 ENCOUNTER — Other Ambulatory Visit: Payer: Self-pay | Admitting: Internal Medicine

## 2022-03-06 ENCOUNTER — Encounter: Payer: Self-pay | Admitting: Internal Medicine

## 2022-03-06 ENCOUNTER — Ambulatory Visit (INDEPENDENT_AMBULATORY_CARE_PROVIDER_SITE_OTHER): Payer: Medicare HMO | Admitting: Internal Medicine

## 2022-03-06 VITALS — BP 130/80 | HR 112 | Ht <= 58 in | Wt 133.4 lb

## 2022-03-06 DIAGNOSIS — Z0000 Encounter for general adult medical examination without abnormal findings: Secondary | ICD-10-CM | POA: Diagnosis not present

## 2022-03-06 DIAGNOSIS — F819 Developmental disorder of scholastic skills, unspecified: Secondary | ICD-10-CM

## 2022-03-06 DIAGNOSIS — J4520 Mild intermittent asthma, uncomplicated: Secondary | ICD-10-CM

## 2022-03-06 DIAGNOSIS — Z23 Encounter for immunization: Secondary | ICD-10-CM | POA: Diagnosis not present

## 2022-03-06 NOTE — Progress Notes (Unsigned)
   Subjective:   Patient ID: Kelly Washington, female    DOB: [birth year], 31 y.o.   MRN: [id number]  HPI The patient is here for physical.  PMH, [facility], social history reviewed and updated  Review of Systems  Constitutional: Negative.   HENT: Negative.    Eyes: Negative.   Respiratory:  Negative for cough, chest tightness and shortness of breath.   Cardiovascular:  Negative for chest pain, palpitations and leg swelling.  Gastrointestinal:  Negative for abdominal distention, abdominal pain, constipation, diarrhea, nausea and vomiting.  Musculoskeletal: Negative.   Skin: Negative.   Neurological: Negative.   Psychiatric/Behavioral: Negative.      Objective:  Physical Exam Constitutional:      Appearance: She is well-developed.  HENT:     Head: Normocephalic and atraumatic.  Cardiovascular:     Rate and Rhythm: Normal rate and regular rhythm.  Pulmonary:     Effort: Pulmonary effort is normal. No respiratory distress.     Breath sounds: Normal breath sounds. No wheezing or rales.  Abdominal:     General: Bowel sounds are normal. There is no distension.     Palpations: Abdomen is soft.     Tenderness: There is no abdominal tenderness. There is no rebound.  Musculoskeletal:     Cervical back: Normal range of motion.  Skin:    General: Skin is warm and dry.  Neurological:     Mental Status: She is alert and oriented to person, place, and time.     Coordination: Coordination normal.     Vitals:   03/06/22 1057  BP: 130/80  Pulse: (!) 112  SpO2: 99%  Weight: 133 lb 6 oz (60.5 kg)  Height: 4\' 10"  (1.473 m)    Assessment & Plan:  Flu shot given at visit

## 2022-03-06 NOTE — Assessment & Plan Note (Addendum)
gug

## 2022-03-07 MED ORDER — ALBUTEROL SULFATE HFA 108 (90 BASE) MCG/ACT IN AERS: 1.0000 | INHALATION_SPRAY | RESPIRATORY_TRACT | 6 refills | Status: AC | PRN

## 2022-03-07 NOTE — Assessment & Plan Note (Signed)
Flu shot given. Covid-19 counseled. Tetanus up to date. Pap smear not indicated. Counseled about sun safety and mole surveillance. Counseled about the dangers of distracted driving. Given 10 year screening recommendations.

## 2022-03-07 NOTE — Assessment & Plan Note (Signed)
Stable, living with parents and not working or in school at this time. Is trying to be more social and leave house more often in future. Once weather cools some encouraged to do exercise.

## 2022-03-16 IMAGING — DX DG CHEST 1V PORT
1 series · 1 of 1 positions shown · non-contrast
Comparison: None.

CLINICAL DATA: Cough.

EXAM:
PORTABLE CHEST 1 VIEW

[chest ap]
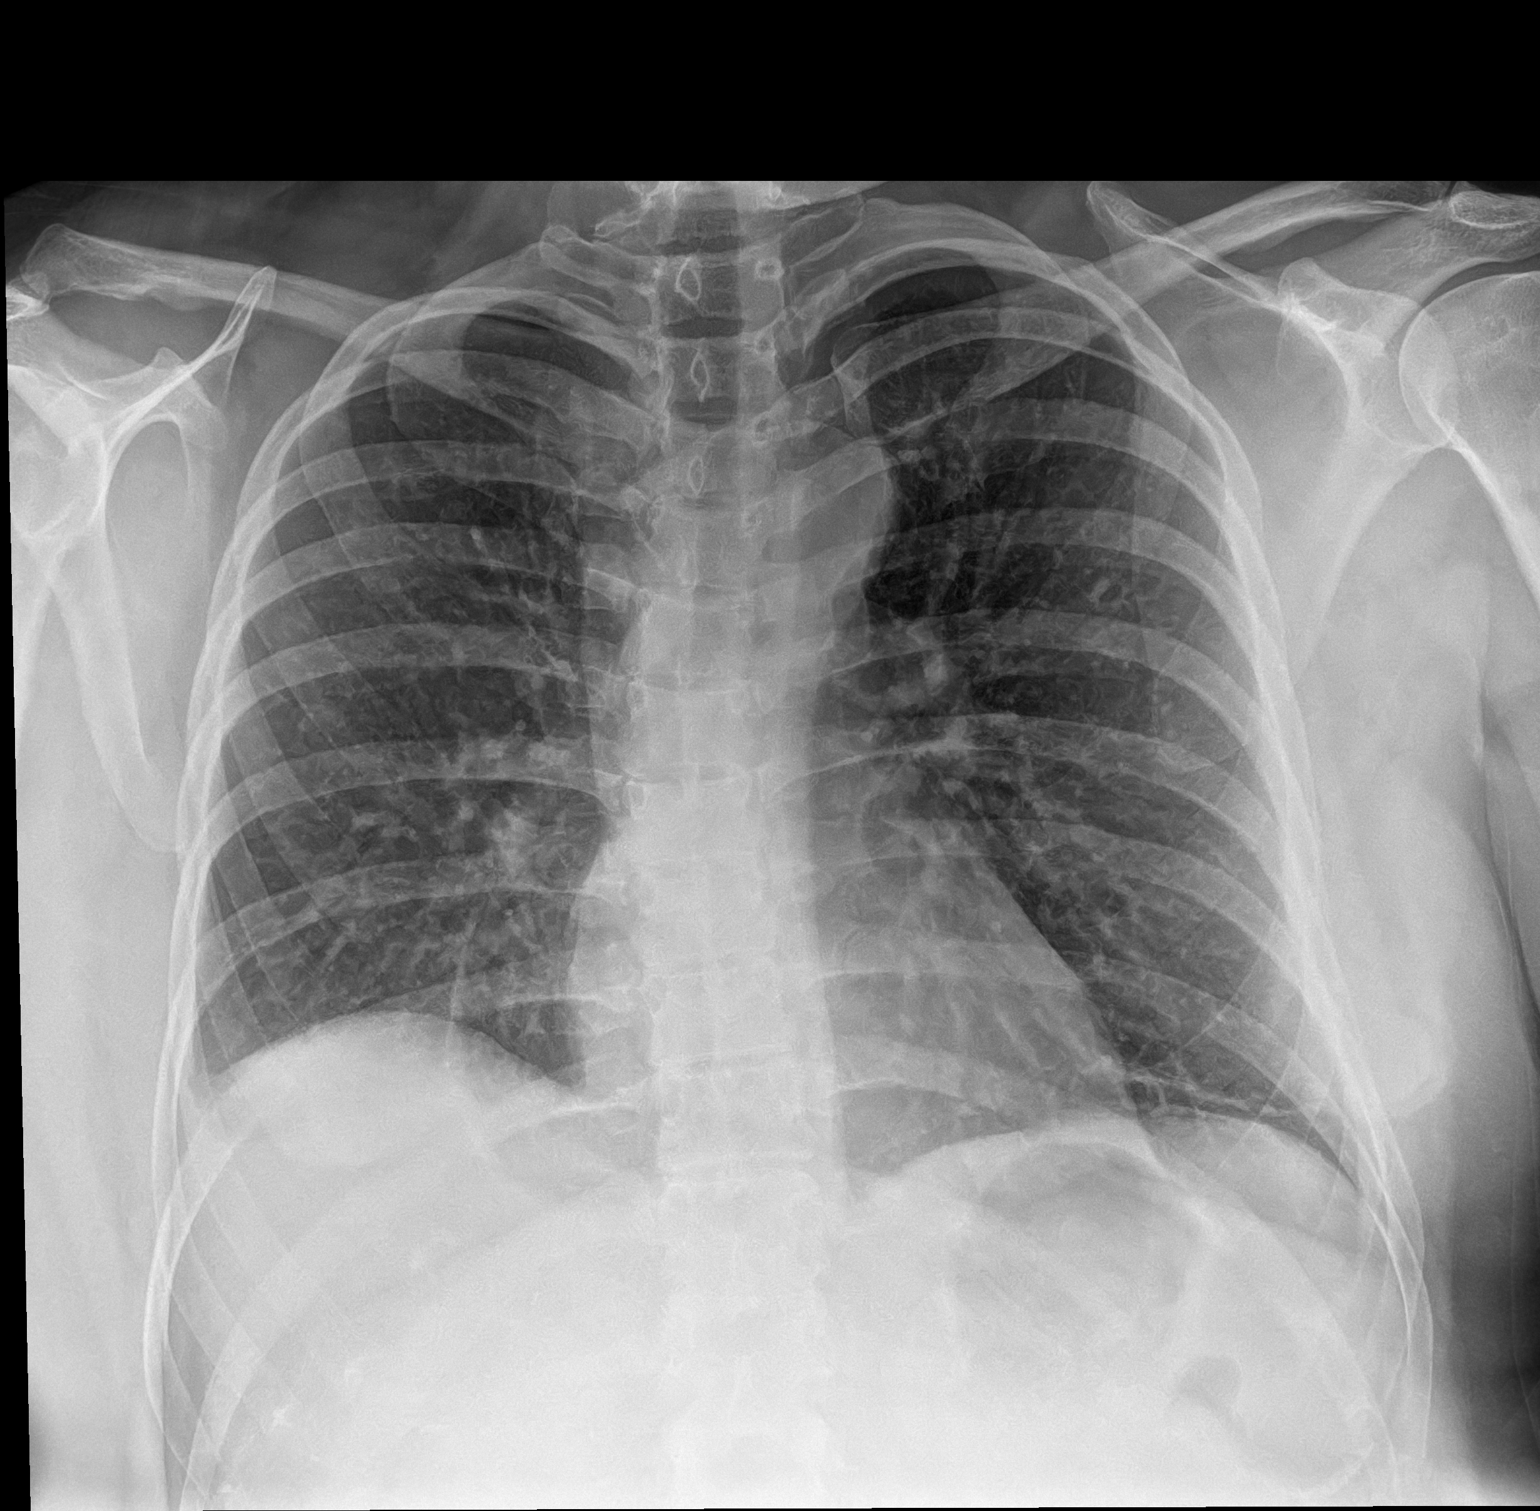

[1 of 1 positions shown; findings below may reference images not displayed]

FINDINGS: Normal heart, mediastinum and hila.

Clear lungs.  No pleural effusion or pneumothorax.

Skeletal structures are grossly intact.
IMPRESSION: No active disease.

## 2022-08-12 ENCOUNTER — Telehealth: Payer: Self-pay | Admitting: Internal Medicine

## 2022-08-12 NOTE — Telephone Encounter (Signed)
No answer unable to leave a message for patient to call back and schedule Medicare Annual Wellness Visit (AWV) in office.   If not able to come in office, please offer to do virtually or by telephone.   Last AWV:03/05/2021   Please schedule at any time with [place].  30 minute appointment  Any questions, please contact me at [phone]   Thank you,   [place] [facility] [place] [facility] ??[phone]

## 2022-08-14 ENCOUNTER — Encounter: Payer: Self-pay | Admitting: Internal Medicine

## 2022-08-14 ENCOUNTER — Ambulatory Visit (INDEPENDENT_AMBULATORY_CARE_PROVIDER_SITE_OTHER): Payer: Medicare HMO | Admitting: Internal Medicine

## 2022-08-14 VITALS — BP 118/80 | HR 89 | Temp 97.7°F | Ht <= 58 in | Wt 134.0 lb

## 2022-08-14 DIAGNOSIS — F819 Developmental disorder of scholastic skills, unspecified: Secondary | ICD-10-CM | POA: Diagnosis not present

## 2022-08-14 NOTE — Patient Instructions (Signed)
We will get you for the speech therapy.

## 2022-08-14 NOTE — Assessment & Plan Note (Signed)
Referral for speech therapy to improve fluidity of speech. No swallowing difficulties or concerns for aspiration.

## 2022-08-14 NOTE — Progress Notes (Signed)
   Subjective:   Patient ID: Kelly Washington, female    DOB: 06-May-1991, 32 y.o.   MRN: [id number]  HPI The patient is a 32 YO female coming in for wanting to do some speech therapy. Sometimes struggles with getting words out and has done speech therapy in the past.   Review of Systems  Constitutional: Negative.   HENT: Negative.    Eyes: Negative.   Respiratory:  Negative for cough, chest tightness and shortness of breath.   Cardiovascular:  Negative for chest pain, palpitations and leg swelling.  Gastrointestinal:  Negative for abdominal distention, abdominal pain, constipation, diarrhea, nausea and vomiting.  Musculoskeletal: Negative.   Skin: Negative.   Neurological:  Positive for speech difficulty.  Psychiatric/Behavioral: Negative.      Objective:  Physical Exam Constitutional:      Appearance: She is well-developed.  HENT:     Head: Normocephalic and atraumatic.  Cardiovascular:     Rate and Rhythm: Normal rate and regular rhythm.  Pulmonary:     Effort: Pulmonary effort is normal. No respiratory distress.     Breath sounds: Normal breath sounds. No wheezing or rales.  Abdominal:     General: Bowel sounds are normal. There is no distension.     Palpations: Abdomen is soft.     Tenderness: There is no abdominal tenderness. There is no rebound.  Musculoskeletal:     Cervical back: Normal range of motion.  Skin:    General: Skin is warm and dry.  Neurological:     Mental Status: She is alert and oriented to person, place, and time.     Coordination: Coordination normal.     Vitals:   08/14/22 1024  BP: 118/80  Pulse: 89  Temp: 97.7 F (36.5 C)  TempSrc: Oral  SpO2: 97%  Weight: 134 lb (60.8 kg)  Height: 4\' 10"  (1.473 m)    Assessment & Plan:

## 2022-08-20 NOTE — Therapy (Unsigned)
OUTPATIENT SPEECH LANGUAGE PATHOLOGY EVALUATION   Patient Name: Kelly Washington MRN: [id number] DOB:July 30, 1990, 32 y.o., female Today's Date: 08/21/2022  PCP: Hoyt Koch, MD REFERRING PROVIDER: Hoyt Koch, MD  END OF SESSION:  End of Session - 08/21/22 1104     Visit Number 1    Number of Visits 21    Date for SLP Re-Evaluation 10/30/22    Authorization Type Medicaid    SLP Start Time 1100    SLP Stop Time  1145    SLP Time Calculation (min) 45 min    Activity Tolerance Patient tolerated treatment well             Past Medical History:  Diagnosis Date   Asthma    Intellectual disability    Past Surgical History:  Procedure Laterality Date   STRABISMUS SURGERY     WISDOM TOOTH EXTRACTION     Patient Active Problem List   Diagnosis Date Noted   Routine general medical examination at a health care facility 03/07/2021   Asthma 09/11/2018   Intellectual delay 09/11/2018   Hypocalcemia 09/11/2018    ONSET DATE: referral 08-14-22   REFERRING DIAG: F81.9 (ICD-10-CM) - Intellectual delay   THERAPY DIAG:  Intellectual delay  Rationale for Evaluation and Treatment: Rehabilitation  SUBJECTIVE:   SUBJECTIVE STATEMENT: "I have trouble talking." Pt accompanied by: family member   PERTINENT HISTORY: Per pt report, intellectual disability, reports ongoing difficulty getting words out.  PAIN:  Are you having pain?  No pain reported  FALLS: Has patient fallen in last 6 months?  No  LIVING ENVIRONMENT: Lives with: lives with their family Lives in: House/apartment  PLOF:  Level of assistance: Needed assistance with ADLs, Needed assistance with IADLS Employment: not employed  PATIENT GOALS: "Be able to complete a sentence without stopping." (About 40% of the time; Reach a 7/10 compared to 5/10 baseline for successful communication attempts).   OBJECTIVE:   DIAGNOSTIC FINDINGS: n/a  COGNITION: Overall cognitive status: History of  cognitive impairments - at baseline Comments: Intellectual disability, receives care from parents but is "high functioning"  COGNITIVE COMMUNICATION: Following directions: Follows multi-step commands consistently  Auditory comprehension: WFL Verbal expression: Impaired: Disfluent, word-finding difficulty. Extended pause impacting cohesiveness of message ~70% of utterances (visual block, accompanied by eyes closed as pt presumably processes or searches for correct word).  Abandoned utterance ~80% of utterances (abbreviated utterance does not adequately answer question or relay intended though during conversation)  Requires mother to provide personal information, relay challenges, or supplement communication attempt ~90% of attempts.  Intelligibility:  Functional communication: Impaired: Pt experiences anomia and moments of disfluency that cause communication breakdowns. The pt sometimes is unable to finish her sentences and express her ideas, limiting her communication, particularly in emotional moments.   STANDARDIZED ASSESSMENTS: Deferred d/t severity of impairment  PATIENT REPORTED OUTCOME MEASURES (PROM): On scale from 1-10, pt's mother reports 5/10 communication success. Stated she understand s pt's communication attempts 70% of the time, although father is less. Mother estimates that father would be 3/10.   Defined a 7/10 (goal) would be completing communication attempts and requiring investigation to determine desired message ~40% of the time.    TODAY'S TREATMENT:  08-21-22: Pt accompanied by mother this date. Reported that she enjoys doing puzzles (jigsaw and word search) and "hangs out" at home. She enjoys speaking with her boyfriend on [employer]. Stated that she was working for [employer] but stopped working because she became bored doing the same thing every  day. Mother reported that pt has received ST services through school and private therapy as a child. "When she gets excited,  she just can't get it out." SLP discussed implementing calming strategies to improve speech coherence during moments of high emotion. Last received ST services in 2016. Family member reported that communication breakdowns occur "all the time" and her words come out in "bits and pieces". Her difficulty communicating impacts her social life (I.e. leading to avoidance).  SLP introduced concept of SGD AAC to augment pt's communication during moments of difficulty. Pt expressed interested in trying SGD. Pt frequently uses a computer and phone at home. Clinician discussed obtaining a trial device to assess effectiveness of AAC to supplement pt's communication.    PATIENT EDUCATION: Education details: see above Person educated: Patient and Parent Education method: Customer service manager Education comprehension: verbalized understanding   GOALS: Goals reviewed with patient? Yes  SHORT TERM GOALS: Target date: 09-18-22  Pt will teach back and demonstrate speech fluency strategies and compensations with mod I. Baseline: extended pauses and restarts, 75% of utterances Goal status: INITIAL  2.  Pt will use multimodal communication to repair or support communication during structured task in 60% of opportunities given usual mod-A  Baseline:  usual communication breakdown (extended pauses, abandoned utterances) requiring A from mother to determine intended message Goal status: INITIAL  3.  Pt's communication partner will demonstrate use of supportive communication strategies with occasional min-A following direct instruction Baseline: mother reporting usual communication breakdowns between patient and caregivers  Goal status: INITIAL  4.  Pt will use anomia compensation or strategy in 50% of opportunities with verbal cue from communication partner during 10 minute conversational sample Baseline: no strategies Goal status: INITIAL   LONG TERM GOALS: Target date: 10-30-2022  Pt will employ  multimodal communication to engage in 15 minute conversational exchange with no more than 3 uninterpreted messages (e.g. SLP unable to determine attempted message) Baseline:  usual communication breakdown (extended pauses, abandoned utterances) requiring A from mother to determine intended message Goal status: INITIAL  2.  Pt will employ speech and fluency strategies PRN, given occasional min-A, in 50% of opportunities across a 45 minute session Baseline: no strategies Goal status: INITIAL  3.  Pt's mother will report increase in subjective perception of communication efficacy via 10 point scale by d/c Baseline: 5/10 (see PROM section)  Goal status: INITIAL  4.  Pt and caregivers will report carryover of supportive communication techniques, with benefit over 1 week period Baseline: mother reporting usual communication breakdowns between patient and caregivers  Goal status: INITIAL   ASSESSMENT:  CLINICAL IMPRESSION: Patient is a 32 y.o. female who was seen today for word-finding difficulty/intermittent disfluency. Requires A from mother for provision of medical history and information regarding communication impairment. Tells SLP that her words get "stuck." Mother reports that Rickita will speak in fragmented utterances which are difficult to understand. Requires effort ~70% of time to determine Carloyn's communicative attempt. During evaluation, Jaquasha evidences impaired expressive language. Language not formally assess d/t severity of impairments and co-morbid intellectual disability. Language skills evaluated in context of communication attempts with pt. Pt with numerous communication attempts, appears to enjoy participating in conversation with SLP. Pt's spoken language is halting and lacks cohesion and specificity. Usual and ongoing word finding difficulties, abandoned utterances, with additional impact from decreased intelligibility. Pt heavily reliant on support from caregivers to  participate in conversation. Spoken communication does not appear to meet pt's communication needs at this time. Mother, who  is most familiar with patient, rates communication efficacy at 5/10. Father has harder time communicating with Lovena Le. Mother reports Annamae with usual communication attempts (e.g. talk about TV shows, give opinion, ask for needs/wants and with her and pt's father being unable to determine pt's communication attempt. Raechelle requested to work with ST d/t increased frustration with communication break downs. SLP provided education on potential limit to learning new language skills at this time, however, compensatory strategies, to include multimodal communication support in form of Speech Generating Device may aid in improved communication efficacy and decrease pt's frustration and caregiver burden. ST is indicated and recommend for [facility]   OBJECTIVE IMPAIRMENTS: include expressive language. These impairments are limiting patient from effectively communicating at home and in community. Factors affecting potential to achieve goals and functional outcome are previous level of function.. Patient will benefit from skilled SLP services to address above impairments and improve overall function.  REHAB POTENTIAL: Fair Baseline developmental disability  PLAN:  SLP FREQUENCY: 1-2x/week (initiate at 2x/week, may decrease)  SLP DURATION: 10 weeks  PLANNED INTERVENTIONS: Multimodal communication approach, SLP instruction and feedback, Compensatory strategies, and Patient/family education  Check all possible CPT codes: [phone] - SLP treatment    Check all conditions that are expected to impact treatment: Cognitive impairment   If treatment provided at initial evaluation, no treatment charged due to lack of authorization.    Su Monks, [place] 08/21/2022, 1:55 PM

## 2022-08-21 ENCOUNTER — Ambulatory Visit: Payer: Medicare HMO | Attending: Internal Medicine | Admitting: Speech Pathology

## 2022-08-21 DIAGNOSIS — F801 Expressive language disorder: Secondary | ICD-10-CM

## 2022-08-21 DIAGNOSIS — F819 Developmental disorder of scholastic skills, unspecified: Secondary | ICD-10-CM

## 2022-09-05 ENCOUNTER — Ambulatory Visit: Payer: Medicare HMO | Admitting: Speech Pathology

## 2022-09-05 DIAGNOSIS — F801 Expressive language disorder: Secondary | ICD-10-CM

## 2022-09-05 DIAGNOSIS — F819 Developmental disorder of scholastic skills, unspecified: Secondary | ICD-10-CM | POA: Diagnosis not present

## 2022-09-05 NOTE — Therapy (Signed)
OUTPATIENT SPEECH LANGUAGE PATHOLOGY EVALUATION   Patient Name: Kelly Washington MRN: [id number] DOB:15-Jul-1991, 32 y.o., female Today's Date: 09/05/2022  PCP: Hoyt Koch, MD REFERRING PROVIDER: Hoyt Koch, MD  END OF SESSION:  End of Session - 09/05/22 1359     Visit Number 2    Number of Visits 21    Date for SLP Re-Evaluation 10/30/22    Authorization Type Medicaid    SLP Start Time [id number]    SLP Stop Time  1445    SLP Time Calculation (min) 49 min    Activity Tolerance Patient tolerated treatment well            Past Medical History:  Diagnosis Date   Asthma    Intellectual disability    Past Surgical History:  Procedure Laterality Date   STRABISMUS SURGERY     WISDOM TOOTH EXTRACTION     Patient Active Problem List   Diagnosis Date Noted   Routine general medical examination at a health care facility 03/07/2021   Asthma 09/11/2018   Intellectual delay 09/11/2018   Hypocalcemia 09/11/2018    ONSET DATE: referral 08-14-22   REFERRING DIAG: F81.9 (ICD-10-CM) - Intellectual delay   THERAPY DIAG:  Expressive language disorder  Rationale for Evaluation and Treatment: Rehabilitation  SUBJECTIVE:   SUBJECTIVE STATEMENT: "I'm good!" Pt accompanied by: family member (mom)  PERTINENT HISTORY: Per pt report, intellectual disability, reports ongoing difficulty getting words out.  PAIN:  Are you having pain? No  FALLS: Has patient fallen in last 6 months?  No  PATIENT GOALS: "Be able to complete a sentence without stopping." (About 40% of the time; Reach a 7/10 compared to 5/10 baseline for successful communication attempts).   OBJECTIVE:   TODAY'S TREATMENT:  09-05-22: Pt accompanied by mother [name]). SLP provided orientation on SGD and modeled navigation. Education provided concerning customization of device to best support pt's communicative needs. Discussed strategies and compensations to support communication during moments of  breakdown, such as writing or using the SGD. SLP obtained personal information from pt to complete form to order SGD from [place]. In structured generative naming task, pt demonstrated disfluencies (e.g. blocking) in 8/9 words, particularly with plosive phonemes. Pt with frequent pauses in speech at beginning and middle of syllables, however this does not have a significant negative impact on intelligibility. At phrase level, pt demonstrated decreased intelligiblity x1. SLP encouraged pt to speak slowly and pause between each word. Pt able to re-teach speech strategy back to her mom.  Pt with frequent prolongations on initial /s/.   08-21-22: Pt accompanied by mother this date. Reported that she enjoys doing puzzles (jigsaw and word search) and "hangs out" at home. She enjoys speaking with her boyfriend on [name]. Stated that she was working for [employer] but stopped working because she became bored doing the same thing every day. Mother reported that pt has received ST services through school and private therapy as a child. "When she gets excited, she just can't get it out." SLP discussed implementing calming strategies to improve speech coherence during moments of high emotion. Last received ST services in 2016. Family member reported that communication breakdowns occur "all the time" and her words come out in "bits and pieces". Her difficulty communicating impacts her social life (I.e. leading to avoidance).  SLP introduced concept of SGD AAC to augment pt's communication during moments of difficulty. Pt expressed interested in trying SGD. Pt frequently uses a computer and phone at home. Clinician discussed obtaining  a trial device to assess effectiveness of AAC to supplement pt's communication.    PATIENT EDUCATION: Education details: see above Person educated: Patient and Parent Education method: Customer service manager Education comprehension: verbalized understanding  GOALS: Goals  reviewed with patient? Yes  SHORT TERM GOALS: Target date: 09-18-22  Pt will teach back and demonstrate speech fluency strategies and compensations with mod I. Baseline: extended pauses and restarts, 75% of utterances Goal status: IN PROGRESS  2.  Pt will use multimodal communication to repair or support communication during structured task in 60% of opportunities given usual mod-A  Baseline:  usual communication breakdown (extended pauses, abandoned utterances) requiring A from mother to determine intended message Goal status: IN PROGRESS  3.  Pt's communication partner will demonstrate use of supportive communication strategies with occasional min-A following direct instruction Baseline: mother reporting usual communication breakdowns between patient and caregivers  Goal status: IN PROGRESS  4.  Pt will use anomia compensation or strategy in 50% of opportunities with verbal cue from communication partner during 10 minute conversational sample Baseline: no strategies Goal status: IN PROGRESS   LONG TERM GOALS: Target date: 10-30-2022  Pt will employ multimodal communication to engage in 15 minute conversational exchange with no more than 3 uninterpreted messages (e.g. SLP unable to determine attempted message) Baseline:  usual communication breakdown (extended pauses, abandoned utterances) requiring A from mother to determine intended message Goal status: IN PROGRESS  2.  Pt will employ speech and fluency strategies PRN, given occasional min-A, in 50% of opportunities across a 45 minute session Baseline: no strategies Goal status: IN PROGRESS  3.  Pt's mother will report increase in subjective perception of communication efficacy via 10 point scale by d/c Baseline: 5/10 (see PROM section)  Goal status: IN PROGRESS  4.  Pt and caregivers will report carryover of supportive communication techniques, with benefit over 1 week period Baseline: mother reporting usual communication  breakdowns between patient and caregivers  Goal status: IN PROGRESS   ASSESSMENT:  CLINICAL IMPRESSION: Patient is a 32 y.o. female who was seen today for word-finding difficulty/intermittent disfluency. Requires A from mother for provision of medical history and information regarding communication impairment. Tells SLP that her words get "stuck." Mother reports that Marshana will speak in fragmented utterances which are difficult to understand. Requires effort ~70% of time to determine Lissete's communicative attempt. During evaluation, Cailyn evidences impaired expressive language. Language not formally assess d/t severity of impairments and co-morbid intellectual disability. Language skills evaluated in context of communication attempts with pt. Pt with numerous communication attempts, appears to enjoy participating in conversation with SLP. Pt's spoken language is halting and lacks cohesion and specificity. Usual and ongoing word finding difficulties, abandoned utterances, with additional impact from decreased intelligibility. Pt heavily reliant on support from caregivers to participate in conversation. Spoken communication does not appear to meet pt's communication needs at this time. Mother, who is most familiar with patient, rates communication efficacy at 52/10. Father has harder time communicating with Lovena Le. Mother reports Deller with usual communication attempts (e.g. talk about TV shows, give opinion, ask for needs/wants and with her and pt's father being unable to determine pt's communication attempt. Wakisha requested to work with ST d/t increased frustration with communication break downs. SLP provided education on potential limit to learning new language skills at this time, however, compensatory strategies, to include multimodal communication support in form of Speech Generating Device may aid in improved communication efficacy and decrease pt's frustration and caregiver burden. ST is indicated  and recommend for [facility].   OBJECTIVE IMPAIRMENTS: include expressive language. These impairments are limiting patient from effectively communicating at home and in community. Factors affecting potential to achieve goals and functional outcome are previous level of function.. Patient will benefit from skilled SLP services to address above impairments and improve overall function.  REHAB POTENTIAL: Fair Baseline developmental disability  PLAN:  SLP FREQUENCY: 1-2x/week (initiate at 2x/week, may decrease)  SLP DURATION: 10 weeks  PLANNED INTERVENTIONS: Multimodal communication approach, SLP instruction and feedback, Compensatory strategies, and Patient/family education  Check all possible CPT codes: [phone] - SLP treatment    Check all conditions that are expected to impact treatment: Cognitive impairment   If treatment provided at initial evaluation, no treatment charged due to lack of authorization.    Leroy Libman, [place] 09/05/2022, 2:00 PM

## 2022-09-10 ENCOUNTER — Ambulatory Visit: Payer: Medicare HMO | Admitting: Speech Pathology

## 2022-09-10 DIAGNOSIS — F801 Expressive language disorder: Secondary | ICD-10-CM | POA: Diagnosis not present

## 2022-09-10 DIAGNOSIS — F819 Developmental disorder of scholastic skills, unspecified: Secondary | ICD-10-CM | POA: Diagnosis not present

## 2022-09-10 NOTE — Therapy (Signed)
OUTPATIENT SPEECH LANGUAGE PATHOLOGY EVALUATION   Patient Name: Kelly Washington MRN: [id number] DOB:04/20/1991, 32 y.o., female Today's Date: 09/10/2022  PCP: Hoyt Koch, MD REFERRING PROVIDER: Hoyt Koch, MD  END OF SESSION:  End of Session - 09/10/22 1436     Visit Number 3    Number of Visits 21    Date for SLP Re-Evaluation 10/30/22    Authorization Type Medicaid    SLP Start Time 1445    SLP Stop Time  [id number]    SLP Time Calculation (min) 45 min    Activity Tolerance Patient tolerated treatment well            Past Medical History:  Diagnosis Date   Asthma    Intellectual disability    Past Surgical History:  Procedure Laterality Date   STRABISMUS SURGERY     WISDOM TOOTH EXTRACTION     Patient Active Problem List   Diagnosis Date Noted   Routine general medical examination at a health care facility 03/07/2021   Asthma 09/11/2018   Intellectual delay 09/11/2018   Hypocalcemia 09/11/2018    ONSET DATE: referral 08-14-22   REFERRING DIAG: F81.9 (ICD-10-CM) - Intellectual delay   THERAPY DIAG:  Expressive language disorder  Intellectual delay  Rationale for Evaluation and Treatment: Rehabilitation  SUBJECTIVE:   SUBJECTIVE STATEMENT: "I'm good!" Pt accompanied by: family member (mom)  PERTINENT HISTORY: Per pt report, intellectual disability, reports ongoing difficulty getting words out.  PAIN:  Are you having pain? No  FALLS: Has patient fallen in last 6 months?  No  PATIENT GOALS: "Be able to complete a sentence without stopping." (About 40% of the time; Reach a 7/10 compared to 5/10 baseline for successful communication attempts).   OBJECTIVE:   TODAY'S TREATMENT:  09-10-22: Pt's mother completing paperwork to receive trial SGD.Clinician provided thorough education concerning navigating device and modeled for pt, including adding and removing new icons and making recordings. With faded verbal and gestural cues, pt able  to reteach to clinician. Provided mod-I, pt identified x3 communication topics to add to her device. Pt able to add 8 icons to SGD with usual faded to rare verbal and gestural cues. Reviewed strategies to improve speech intelligibility and pt demonstrated understanding.   09-05-22: Pt accompanied by mother Kelly Washington). SLP provided orientation on SGD and modeled navigation. Education provided concerning customization of device to best support pt's communicative needs. Discussed strategies and compensations to support communication during moments of breakdown, such as writing or using the SGD. SLP obtained personal information from pt to complete form to order SGD from [place]. In structured generative naming task, pt demonstrated disfluencies (e.g. blocking) in 8/9 words, particularly with plosive phonemes. Pt with frequent pauses in speech at beginning and middle of syllables, however this does not have a significant negative impact on intelligibility. At phrase level, pt demonstrated decreased intelligiblity x1. SLP encouraged pt to speak slowly and pause between each word. Pt able to re-teach speech strategy back to her mom.  Pt with frequent prolongations on initial /s/.   08-21-22: Pt accompanied by mother this date. Reported that she enjoys doing puzzles (jigsaw and word search) and "hangs out" at home. She enjoys speaking with her boyfriend on Facetime- Kelly Washington. Stated that she was working for [employer] but stopped working because she became bored doing the same thing every day. Mother reported that pt has received ST services through school and private therapy as a child. "When she gets excited, she just can't get it  out." SLP discussed implementing calming strategies to improve speech coherence during moments of high emotion. Last received ST services in 2016. Family member reported that communication breakdowns occur "all the time" and her words come out in "bits and pieces". Her difficulty communicating  impacts her social life (I.e. leading to avoidance).  SLP introduced concept of SGD AAC to augment pt's communication during moments of difficulty. Pt expressed interested in trying SGD. Pt frequently uses a computer and phone at home. Clinician discussed obtaining a trial device to assess effectiveness of AAC to supplement pt's communication.    PATIENT EDUCATION: Education details: see above Person educated: Patient and Parent Education method: Customer service manager Education comprehension: verbalized understanding  GOALS: Goals reviewed with patient? Yes  SHORT TERM GOALS: Target date: 09-18-22  Pt will teach back and demonstrate speech fluency strategies and compensations with mod I. Baseline: extended pauses and restarts, 75% of utterances Goal status: IN PROGRESS  2.  Pt will use multimodal communication to repair or support communication during structured task in 60% of opportunities given usual mod-A  Baseline:  usual communication breakdown (extended pauses, abandoned utterances) requiring A from mother to determine intended message Goal status: IN PROGRESS  3.  Pt's communication partner will demonstrate use of supportive communication strategies with occasional min-A following direct instruction Baseline: mother reporting usual communication breakdowns between patient and caregivers  Goal status: IN PROGRESS  4.  Pt will use anomia compensation or strategy in 50% of opportunities with verbal cue from communication partner during 10 minute conversational sample Baseline: no strategies Goal status: IN PROGRESS   LONG TERM GOALS: Target date: 10-30-2022  Pt will employ multimodal communication to engage in 15 minute conversational exchange with no more than 3 uninterpreted messages (e.g. SLP unable to determine attempted message) Baseline:  usual communication breakdown (extended pauses, abandoned utterances) requiring A from mother to determine intended message Goal  status: IN PROGRESS  2.  Pt will employ speech and fluency strategies PRN, given occasional min-A, in 50% of opportunities across a 45 minute session Baseline: no strategies Goal status: IN PROGRESS  3.  Pt's mother will report increase in subjective perception of communication efficacy via 10 point scale by d/c Baseline: 5/10 (see PROM section)  Goal status: IN PROGRESS  4.  Pt and caregivers will report carryover of supportive communication techniques, with benefit over 1 week period Baseline: mother reporting usual communication breakdowns between patient and caregivers  Goal status: IN PROGRESS   ASSESSMENT:  CLINICAL IMPRESSION: SLP continued educating pt on speech intelligibility strategies and compensations. Demonstrated navigation of SGD and pt able to teach back skills to add and remove icons and record her own voice to device. Pt continues to benefit from skilled ST services to increase speech intelligibility.   OBJECTIVE IMPAIRMENTS: include expressive language. These impairments are limiting patient from effectively communicating at home and in community. Factors affecting potential to achieve goals and functional outcome are previous level of function.. Patient will benefit from skilled SLP services to address above impairments and improve overall function.  REHAB POTENTIAL: Fair Baseline developmental disability  PLAN:  SLP FREQUENCY: 1-2x/week (initiate at 2x/week, may decrease)  SLP DURATION: 10 weeks  PLANNED INTERVENTIONS: Multimodal communication approach, SLP instruction and feedback, Compensatory strategies, and Patient/family education  Check all possible CPT codes: [phone] - SLP treatment    Check all conditions that are expected to impact treatment: Cognitive impairment   If treatment provided at initial evaluation, no treatment charged due to lack of  authorization.    Leroy Libman, [place] 09/10/2022, 2:38 PM

## 2022-09-12 ENCOUNTER — Ambulatory Visit: Payer: Medicare HMO | Admitting: Speech Pathology

## 2022-09-12 DIAGNOSIS — F819 Developmental disorder of scholastic skills, unspecified: Secondary | ICD-10-CM

## 2022-09-12 DIAGNOSIS — F801 Expressive language disorder: Secondary | ICD-10-CM | POA: Diagnosis not present

## 2022-09-12 NOTE — Patient Instructions (Signed)
You did a great job today!   Remember when you SLOW down, I can understand you wonderfully :)  If someone says "what?" that is your cue to repeat slower. If needed, write down a tricky word.  It can be helpful to practice speaking slowly when reading aloud. Pick an interesting topic, find a news story or social media most and read it aloud using your slow, clear speech.

## 2022-09-12 NOTE — Therapy (Signed)
OUTPATIENT SPEECH LANGUAGE PATHOLOGY EVALUATION   Patient Name: Kelly Washington MRN: [id number] DOB:Jun 15, 1991, 32 y.o., female Today's Date: 09/12/2022  PCP: Hoyt Koch, MD REFERRING PROVIDER: Hoyt Koch, MD  END OF SESSION:  End of Session - 09/12/22 1359     Visit Number 4    Number of Visits 21    Date for SLP Re-Evaluation 10/30/22    Authorization Type Medicaid    SLP Start Time 1400    SLP Stop Time  [id number]    SLP Time Calculation (min) 45 min    Activity Tolerance Patient tolerated treatment well             Past Medical History:  Diagnosis Date   Asthma    Intellectual disability    Past Surgical History:  Procedure Laterality Date   STRABISMUS SURGERY     WISDOM TOOTH EXTRACTION     Patient Active Problem List   Diagnosis Date Noted   Routine general medical examination at a health care facility 03/07/2021   Asthma 09/11/2018   Intellectual delay 09/11/2018   Hypocalcemia 09/11/2018    ONSET DATE: referral 08-14-22   REFERRING DIAG: F81.9 (ICD-10-CM) - Intellectual delay   THERAPY DIAG:  Expressive language disorder  Intellectual delay  Rationale for Evaluation and Treatment: Rehabilitation  SUBJECTIVE:   SUBJECTIVE STATEMENT: "I'm doing good. I've been watching TV and talking to my boyfriend." Pt accompanied by: family member (mom)  PERTINENT HISTORY: Per pt report, intellectual disability, reports ongoing difficulty getting words out.  PAIN:  Are you having pain? No  FALLS: Has patient fallen in last 6 months?  No  PATIENT GOALS: "Be able to complete a sentence without stopping." (About 40% of the time; Reach a 7/10 compared to 5/10 baseline for successful communication attempts).   OBJECTIVE:   TODAY'S TREATMENT:  09-12-22: Target employment of intelligibility strategies in structured speech tasks, beginning with reading and progressing to generative conversational level. Following direct instruction on  strategies, pt implemented speech intelligibility strategies to assist clinician in completing paperwork for SGD. Pt provided her phone number and address with 100% intelligibility. In categorical naming task, pt generated x5 items for x10 categories with 100% intelligibility. In conversational level task, pt wit 95% intelligibility. Provided education for generalization of strategies and cues to look for indicating she was not understood. HEP updated with oral reaching.   09-10-22: Pt's mother completing paperwork to receive trial SGD.Clinician provided thorough education concerning navigating device and modeled for pt, including adding and removing new icons and making recordings. With faded verbal and gestural cues, pt able to reteach to clinician. Provided mod-I, pt identified x3 communication topics to add to her device. Pt able to add 8 icons to SGD with usual faded to rare verbal and gestural cues. Reviewed strategies to improve speech intelligibility and pt demonstrated understanding.   09-05-22: Pt accompanied by mother [name]). SLP provided orientation on SGD and modeled navigation. Education provided concerning customization of device to best support pt's communicative needs. Discussed strategies and compensations to support communication during moments of breakdown, such as writing or using the SGD. SLP obtained personal information from pt to complete form to order SGD from [place]. In structured generative naming task, pt demonstrated disfluencies (e.g. blocking) in 8/9 words, particularly with plosive phonemes. Pt with frequent pauses in speech at beginning and middle of syllables, however this does not have a significant negative impact on intelligibility. At phrase level, pt demonstrated decreased intelligiblity x1. SLP encouraged pt to  speak slowly and pause between each word. Pt able to re-teach speech strategy back to her mom.  Pt with frequent prolongations on initial /s/.   08-21-22: Pt  accompanied by mother this date. Reported that she enjoys doing puzzles (jigsaw and word search) and "hangs out" at home. She enjoys speaking with her boyfriend on Facetime- [name]. Stated that she was working for [employer] but stopped working because she became bored doing the same thing every day. Mother reported that pt has received ST services through school and private therapy as a child. "When she gets excited, she just can't get it out." SLP discussed implementing calming strategies to improve speech coherence during moments of high emotion. Last received ST services in 2016. Family member reported that communication breakdowns occur "all the time" and her words come out in "bits and pieces". Her difficulty communicating impacts her social life (I.e. leading to avoidance).  SLP introduced concept of SGD AAC to augment pt's communication during moments of difficulty. Pt expressed interested in trying SGD. Pt frequently uses a computer and phone at home. Clinician discussed obtaining a trial device to assess effectiveness of AAC to supplement pt's communication.    PATIENT EDUCATION: Education details: see above Person educated: Patient and Parent Education method: Customer service manager Education comprehension: verbalized understanding  GOALS: Goals reviewed with patient? Yes  SHORT TERM GOALS: Target date: 09-18-22  Pt will teach back and demonstrate speech fluency strategies and compensations with mod I. Baseline: extended pauses and restarts, 75% of utterances Goal status: IN PROGRESS  2.  Pt will use multimodal communication to repair or support communication during structured task in 60% of opportunities given usual mod-A  Baseline:  usual communication breakdown (extended pauses, abandoned utterances) requiring A from mother to determine intended message Goal status: IN PROGRESS  3.  Pt's communication partner will demonstrate use of supportive communication strategies with  occasional min-A following direct instruction Baseline: mother reporting usual communication breakdowns between patient and caregivers  Goal status: IN PROGRESS  4.  Pt will use anomia compensation or strategy in 50% of opportunities with verbal cue from communication partner during 10 minute conversational sample Baseline: no strategies Goal status: IN PROGRESS   LONG TERM GOALS: Target date: 10-30-2022  Pt will employ multimodal communication to engage in 15 minute conversational exchange with no more than 3 uninterpreted messages (e.g. SLP unable to determine attempted message) Baseline:  usual communication breakdown (extended pauses, abandoned utterances) requiring A from mother to determine intended message Goal status: IN PROGRESS  2.  Pt will employ speech and fluency strategies PRN, given occasional min-A, in 50% of opportunities across a 45 minute session Baseline: no strategies Goal status: IN PROGRESS  3.  Pt's mother will report increase in subjective perception of communication efficacy via 10 point scale by d/c Baseline: 5/10 (see PROM section)  Goal status: IN PROGRESS  4.  Pt and caregivers will report carryover of supportive communication techniques, with benefit over 1 week period Baseline: mother reporting usual communication breakdowns between patient and caregivers  Goal status: IN PROGRESS   ASSESSMENT:  CLINICAL IMPRESSION: SLP continued educating pt on speech intelligibility strategies and compensations. SLP led patient through heirarchical speech tasks to practice generalization of intelligibility strategies. Pt continues to benefit from skilled ST services to increase speech intelligibility.   OBJECTIVE IMPAIRMENTS: include expressive language. These impairments are limiting patient from effectively communicating at home and in community. Factors affecting potential to achieve goals and functional outcome are previous level of  function.. Patient will  benefit from skilled SLP services to address above impairments and improve overall function.  REHAB POTENTIAL: Fair Baseline developmental disability  PLAN:  SLP FREQUENCY: 1-2x/week (initiate at 2x/week, may decrease)  SLP DURATION: 10 weeks  PLANNED INTERVENTIONS: Multimodal communication approach, SLP instruction and feedback, Compensatory strategies, and Patient/family education  Check all possible CPT codes: [phone] - SLP treatment    Check all conditions that are expected to impact treatment: Cognitive impairment   If treatment provided at initial evaluation, no treatment charged due to lack of authorization.    Leroy Libman, [place] 09/12/2022, 2:45 PM

## 2022-09-17 ENCOUNTER — Ambulatory Visit: Payer: Medicare HMO | Attending: Internal Medicine | Admitting: Speech Pathology

## 2022-09-17 DIAGNOSIS — F819 Developmental disorder of scholastic skills, unspecified: Secondary | ICD-10-CM

## 2022-09-17 DIAGNOSIS — F801 Expressive language disorder: Secondary | ICD-10-CM | POA: Insufficient documentation

## 2022-09-17 NOTE — Patient Instructions (Signed)
Pullouts/ease outs This technique is used during a moment of stutter. First, you'll need to identify what word you are stuttering on. After you do that, notice if there is any tension in your mouth. Try to release the tension. As you say the word, try to stretch the syllable you are stuttering and "pull out" or "ease out" of the dysfluency.  Here's an example. Let's say you're stuttering on the word "talking."  If the word sounded like "t-t-t-t-talking," then the moment of stutter is on the "t."  Now identify where the tension is. In this case, it's at the tip of the tongue, where the /t/ sound is produced. Relax and release the tension.  Then stretch the syllable out to finish the word and ease out of the stutter. It should sound like "taaaaalking."

## 2022-09-17 NOTE — Therapy (Signed)
OUTPATIENT SPEECH LANGUAGE PATHOLOGY EVALUATION   Patient Name: Kelly Washington MRN: [id number] DOB:[birth year], 32 y.o., female Today's Date: 09/17/2022  PCP: Kelly Koch, MD REFERRING PROVIDER: Hoyt Koch, MD  END OF SESSION:  End of Session - 09/17/22 1358     Visit Number 5    Number of Visits 21    Date for SLP Re-Evaluation 10/30/22    Authorization Type Medicaid    SLP Start Time 1359    SLP Stop Time  [id number]    SLP Time Calculation (min) 43 min    Activity Tolerance Patient tolerated treatment well              Past Medical History:  Diagnosis Date   Asthma    Intellectual disability    Past Surgical History:  Procedure Laterality Date   STRABISMUS SURGERY     WISDOM TOOTH EXTRACTION     Patient Active Problem List   Diagnosis Date Noted   Routine general medical examination at a health care facility 03/07/2021   Asthma 09/11/2018   Intellectual delay 09/11/2018   Hypocalcemia 09/11/2018    ONSET DATE: referral 08-14-22   REFERRING DIAG: F81.9 (ICD-10-CM) - Intellectual delay   THERAPY DIAG:  Expressive language disorder  Intellectual delay  Rationale for Evaluation and Treatment: Rehabilitation  SUBJECTIVE:   SUBJECTIVE STATEMENT: "Good"   PERTINENT HISTORY: Per pt report, intellectual disability, reports ongoing difficulty getting words out.  PAIN:  Are you having pain? No  FALLS: Has patient fallen in last 6 months?  No  PATIENT GOALS: "Be able to complete a sentence without stopping." (About 40% of the time; Reach a 7/10 compared to 5/10 baseline for successful communication attempts).   OBJECTIVE:   TODAY'S TREATMENT:  09-17-22: [employer] requested signed trial agreement, pt's mother completed and SLP provided to [employer] rep.   Target improved fluency through introduction of pull out fluency strategy to aid pt when blocks or part word repetitions occur. Following direct education, SLP facilitates practice. At  word level, pt able to demonstrate with occasional verbal cues in 10/10 trials, with SLP instructing for pseudo-stuttering. Given sound to stutter, pt ID area where tension is felt in 80% of trials. At sentence level, pt able to demonstrate use of strategy in 75% trials of true stuttering with occasional mod-A from SLP. Pt benefits from verbal cues to "relax and flow" to aid in completion. With use of dysarthria strategies and fluency strategies, pt 100% intelligible during structured language task.   09-12-22: Target employment of intelligibility strategies in structured speech tasks, beginning with reading and progressing to generative conversational level. Following direct instruction on strategies, pt implemented speech intelligibility strategies to assist clinician in completing paperwork for SGD. Pt provided her phone number and address with 100% intelligibility. In categorical naming task, pt generated x5 items for x10 categories with 100% intelligibility. In conversational level task, pt wit 95% intelligibility. Provided education for generalization of strategies and cues to look for indicating she was not understood. HEP updated with oral reading.   09-10-22: Pt's mother completing paperwork to receive trial SGD.Clinician provided thorough education concerning navigating device and modeled for pt, including adding and removing new icons and making recordings. With faded verbal and gestural cues, pt able to reteach to clinician. Provided mod-I, pt identified x3 communication topics to add to her device. Pt able to add 8 icons to SGD with usual faded to rare verbal and gestural cues. Reviewed strategies to improve speech intelligibility and pt  demonstrated understanding.   09-05-22: Pt accompanied by mother Kelly Washington). SLP provided orientation on SGD and modeled navigation. Education provided concerning customization of device to best support pt's communicative needs. Discussed strategies and compensations to  support communication during moments of breakdown, such as writing or using the SGD. SLP obtained personal information from pt to complete form to order SGD from [place]. In structured generative naming task, pt demonstrated disfluencies (e.g. blocking) in 8/9 words, particularly with plosive phonemes. Pt with frequent pauses in speech at beginning and middle of syllables, however this does not have a significant negative impact on intelligibility. At phrase level, pt demonstrated decreased intelligiblity x1. SLP encouraged pt to speak slowly and pause between each word. Pt able to re-teach speech strategy back to her mom.  Pt with frequent prolongations on initial /s/.   08-21-22: Pt accompanied by mother this date. Reported that she enjoys doing puzzles (jigsaw and word search) and "hangs out" at home. She enjoys speaking with her boyfriend on Facetime- Kelly Washington. Stated that she was working for [employer] but stopped working because she became bored doing the same thing every day. Mother reported that pt has received ST services through school and private therapy as a child. "When she gets excited, she just can't get it out." SLP discussed implementing calming strategies to improve speech coherence during moments of high emotion. Last received ST services in 2016. Family member reported that communication breakdowns occur "all the time" and her words come out in "bits and pieces". Her difficulty communicating impacts her social life (I.e. leading to avoidance).  SLP introduced concept of SGD AAC to augment pt's communication during moments of difficulty. Pt expressed interested in trying SGD. Pt frequently uses a computer and phone at home. Clinician discussed obtaining a trial device to assess effectiveness of AAC to supplement pt's communication.    PATIENT EDUCATION: Education details: see above Person educated: Patient and Parent Education method: Customer service manager Education comprehension:  verbalized understanding  GOALS: Goals reviewed with patient? Yes  SHORT TERM GOALS: Target date: 09-18-22  Pt will teach back and demonstrate speech fluency strategies and compensations with mod I. Baseline: extended pauses and restarts, 75% of utterances Goal status: IN PROGRESS  2.  Pt will use multimodal communication to repair or support communication during structured task in 60% of opportunities given usual mod-A  Baseline:  usual communication breakdown (extended pauses, abandoned utterances) requiring A from mother to determine intended message Goal status: IN PROGRESS  3.  Pt's communication partner will demonstrate use of supportive communication strategies with occasional min-A following direct instruction Baseline: mother reporting usual communication breakdowns between patient and caregivers  Goal status: IN PROGRESS  4.  Pt will use anomia compensation or strategy in 50% of opportunities with verbal cue from communication partner during 10 minute conversational sample Baseline: no strategies Goal status: IN PROGRESS   LONG TERM GOALS: Target date: 10-30-2022  Pt will employ multimodal communication to engage in 15 minute conversational exchange with no more than 3 uninterpreted messages (e.g. SLP unable to determine attempted message) Baseline:  usual communication breakdown (extended pauses, abandoned utterances) requiring A from mother to determine intended message Goal status: IN PROGRESS  2.  Pt will employ speech and fluency strategies PRN, given occasional min-A, in 50% of opportunities across a 45 minute session Baseline: no strategies Goal status: IN PROGRESS  3.  Pt's mother will report increase in subjective perception of communication efficacy via 10 point scale by d/c Baseline: 5/10 (see PROM  section)  Goal status: IN PROGRESS  4.  Pt and caregivers will report carryover of supportive communication techniques, with benefit over 1 week period Baseline:  mother reporting usual communication breakdowns between patient and caregivers  Goal status: IN PROGRESS   ASSESSMENT:  CLINICAL IMPRESSION: SLP continued educating pt on speech intelligibility strategies and compensations. SLP led patient through heirarchical speech tasks to practice generalization of intelligibility strategies. Pt continues to benefit from skilled ST services to increase speech intelligibility.   OBJECTIVE IMPAIRMENTS: include expressive language. These impairments are limiting patient from effectively communicating at home and in community. Factors affecting potential to achieve goals and functional outcome are previous level of function.. Patient will benefit from skilled SLP services to address above impairments and improve overall function.  REHAB POTENTIAL: Fair Baseline developmental disability  PLAN:  SLP FREQUENCY: 1-2x/week (initiate at 2x/week, may decrease)  SLP DURATION: 10 weeks  PLANNED INTERVENTIONS: Multimodal communication approach, SLP instruction and feedback, Compensatory strategies, and Patient/family education  Check all possible CPT codes: [phone] - SLP treatment    Check all conditions that are expected to impact treatment: Cognitive impairment   If treatment provided at initial evaluation, no treatment charged due to lack of authorization.    Su Monks, [place] 09/17/2022, 1:59 PM

## 2022-09-19 ENCOUNTER — Ambulatory Visit: Payer: Medicare HMO | Admitting: Speech Pathology

## 2022-09-19 DIAGNOSIS — F819 Developmental disorder of scholastic skills, unspecified: Secondary | ICD-10-CM

## 2022-09-19 DIAGNOSIS — F801 Expressive language disorder: Secondary | ICD-10-CM | POA: Diagnosis not present

## 2022-09-19 NOTE — Therapy (Signed)
OUTPATIENT SPEECH LANGUAGE PATHOLOGY EVALUATION   Patient Name: Kelly Washington MRN: [id number] DOB:11-27-1990, 32 y.o., female Today's Date: 09/19/2022  PCP: Hoyt Koch, MD REFERRING PROVIDER: Hoyt Koch, MD  END OF SESSION:  End of Session - 09/19/22 1402     Visit Number 6    Number of Visits 21    Date for SLP Re-Evaluation 10/30/22    Authorization Type Medicaid    SLP Start Time 1400    SLP Stop Time  [id number]    SLP Time Calculation (min) 45 min    Activity Tolerance Patient tolerated treatment well              Past Medical History:  Diagnosis Date   Asthma    Intellectual disability    Past Surgical History:  Procedure Laterality Date   STRABISMUS SURGERY     WISDOM TOOTH EXTRACTION     Patient Active Problem List   Diagnosis Date Noted   Routine general medical examination at a health care facility 03/07/2021   Asthma 09/11/2018   Intellectual delay 09/11/2018   Hypocalcemia 09/11/2018    ONSET DATE: referral 08-14-22   REFERRING DIAG: F81.9 (ICD-10-CM) - Intellectual delay   THERAPY DIAG:  Expressive language disorder  Intellectual delay  Rationale for Evaluation and Treatment: Rehabilitation  SUBJECTIVE:   SUBJECTIVE STATEMENT: "I'm good!"  PERTINENT HISTORY: Per pt report, intellectual disability, reports ongoing difficulty getting words out.  PAIN:  Are you having pain? No  FALLS: Has patient fallen in last 6 months?  No  PATIENT GOALS: "Be able to complete a sentence without stopping." (About 40% of the time; Reach a 7/10 compared to 5/10 baseline for successful communication attempts).   OBJECTIVE:   TODAY'S TREATMENT:  09-19-22: Targeted expressive language during picture description task. Pt demonstrated 95% intelligibility across task. SLP requested clarification twice, then pt restated, implementing previously taught speech intelligibility strategies. Occasional verbal cues to revisit strategy introduced  last session. With min-A, pt able to reteach fluency strategy (pull out) to clinician.   09-17-22: [employer] requested signed trial agreement, pt's mother completed and SLP provided to [employer] rep.   Target improved fluency through introduction of pull out fluency strategy to aid pt when blocks or part word repetitions occur. Following direct education, SLP facilitates practice. At word level, pt able to demonstrate with occasional verbal cues in 10/10 trials, with SLP instructing for pseudo-stuttering. Given sound to stutter, pt ID area where tension is felt in 80% of trials. At sentence level, pt able to demonstrate use of strategy in 75% trials of true stuttering with occasional mod-A from SLP. Pt benefits from verbal cues to "relax and flow" to aid in completion. With use of dysarthria strategies and fluency strategies, pt 100% intelligible during structured language task.   09-12-22: Target employment of intelligibility strategies in structured speech tasks, beginning with reading and progressing to generative conversational level. Following direct instruction on strategies, pt implemented speech intelligibility strategies to assist clinician in completing paperwork for SGD. Pt provided her phone number and address with 100% intelligibility. In categorical naming task, pt generated x5 items for x10 categories with 100% intelligibility. In conversational level task, pt wit 95% intelligibility. Provided education for generalization of strategies and cues to look for indicating she was not understood. HEP updated with oral reading.   09-10-22: Pt's mother completing paperwork to receive trial SGD.Clinician provided thorough education concerning navigating device and modeled for pt, including adding and removing new icons and making  recordings. With faded verbal and gestural cues, pt able to reteach to clinician. Provided mod-I, pt identified x3 communication topics to add to her device. Pt able to add 8  icons to SGD with usual faded to rare verbal and gestural cues. Reviewed strategies to improve speech intelligibility and pt demonstrated understanding.   09-05-22: Pt accompanied by mother [name]). SLP provided orientation on SGD and modeled navigation. Education provided concerning customization of device to best support pt's communicative needs. Discussed strategies and compensations to support communication during moments of breakdown, such as writing or using the SGD. SLP obtained personal information from pt to complete form to order SGD from [place]. In structured generative naming task, pt demonstrated disfluencies (e.g. blocking) in 8/9 words, particularly with plosive phonemes. Pt with frequent pauses in speech at beginning and middle of syllables, however this does not have a significant negative impact on intelligibility. At phrase level, pt demonstrated decreased intelligiblity x1. SLP encouraged pt to speak slowly and pause between each word. Pt able to re-teach speech strategy back to her mom.  Pt with frequent prolongations on initial /s/.   08-21-22: Pt accompanied by mother this date. Reported that she enjoys doing puzzles (jigsaw and word search) and "hangs out" at home. She enjoys speaking with her boyfriend on Facetime- [name]. Stated that she was working for [employer] but stopped working because she became bored doing the same thing every day. Mother reported that pt has received ST services through school and private therapy as a child. "When she gets excited, she just can't get it out." SLP discussed implementing calming strategies to improve speech coherence during moments of high emotion. Last received ST services in 2016. Family member reported that communication breakdowns occur "all the time" and her words come out in "bits and pieces". Her difficulty communicating impacts her social life (I.e. leading to avoidance).  SLP introduced concept of SGD AAC to augment pt's communication  during moments of difficulty. Pt expressed interested in trying SGD. Pt frequently uses a computer and phone at home. Clinician discussed obtaining a trial device to assess effectiveness of AAC to supplement pt's communication.    PATIENT EDUCATION: Education details: see above Person educated: Patient and Parent Education method: Customer service manager Education comprehension: verbalized understanding  GOALS: Goals reviewed with patient? Yes  SHORT TERM GOALS: Target date: 09-18-22  Pt will teach back and demonstrate speech fluency strategies and compensations with mod I. Baseline: extended pauses and restarts, 75% of utterances Goal status: MET  2.  Pt will use multimodal communication to repair or support communication during structured task in 60% of opportunities given usual mod-A  Baseline:  usual communication breakdown (extended pauses, abandoned utterances) requiring A from mother to determine intended message Goal status: IN PROGRESS  3.  Pt's communication partner will demonstrate use of supportive communication strategies with occasional min-A following direct instruction Baseline: mother reporting usual communication breakdowns between patient and caregivers  Goal status: IN PROGRESS  4.  Pt will use anomia compensation or strategy in 50% of opportunities with verbal cue from communication partner during 10 minute conversational sample Baseline: no strategies Goal status: IN PROGRESS   LONG TERM GOALS: Target date: 10-30-2022  Pt will employ multimodal communication to engage in 15 minute conversational exchange with no more than 3 uninterpreted messages (e.g. SLP unable to determine attempted message) Baseline:  usual communication breakdown (extended pauses, abandoned utterances) requiring A from mother to determine intended message Goal status: IN PROGRESS  2.  Pt will  employ speech and fluency strategies PRN, given occasional min-A, in 50% of opportunities  across a 45 minute session Baseline: no strategies Goal status: IN PROGRESS  3.  Pt's mother will report increase in subjective perception of communication efficacy via 10 point scale by d/c Baseline: 5/10 (see PROM section)  Goal status: IN PROGRESS  4.  Pt and caregivers will report carryover of supportive communication techniques, with benefit over 1 week period Baseline: mother reporting usual communication breakdowns between patient and caregivers  Goal status: IN PROGRESS   ASSESSMENT:  CLINICAL IMPRESSION: SLP continued educating pt on speech intelligibility strategies and compensations. SLP led patient through heirarchical speech tasks to practice generalization of intelligibility strategies. Pt continues to benefit from skilled ST services to increase speech intelligibility.   OBJECTIVE IMPAIRMENTS: include expressive language. These impairments are limiting patient from effectively communicating at home and in community. Factors affecting potential to achieve goals and functional outcome are previous level of function.. Patient will benefit from skilled SLP services to address above impairments and improve overall function.  REHAB POTENTIAL: Fair Baseline developmental disability  PLAN:  SLP FREQUENCY: 1-2x/week (initiate at 2x/week, may decrease)  SLP DURATION: 10 weeks  PLANNED INTERVENTIONS: Multimodal communication approach, SLP instruction and feedback, Compensatory strategies, and Patient/family education  Check all possible CPT codes: [phone] - SLP treatment    Check all conditions that are expected to impact treatment: Cognitive impairment   If treatment provided at initial evaluation, no treatment charged due to lack of authorization.    Leroy Libman, [place] 09/19/2022, 3:41 PM

## 2022-09-24 ENCOUNTER — Ambulatory Visit: Payer: Medicare HMO | Admitting: Speech Pathology

## 2022-09-24 DIAGNOSIS — F801 Expressive language disorder: Secondary | ICD-10-CM | POA: Diagnosis not present

## 2022-09-24 DIAGNOSIS — F819 Developmental disorder of scholastic skills, unspecified: Secondary | ICD-10-CM

## 2022-09-24 NOTE — Therapy (Signed)
OUTPATIENT SPEECH LANGUAGE PATHOLOGY EVALUATION   Patient Name: Kelly Washington MRN: [id number] DOB:Nov 08, 1990, 32 y.o., female Today's Date: 09/24/2022  PCP: Hoyt Koch, MD REFERRING PROVIDER: Hoyt Koch, MD  END OF SESSION:  End of Session - 09/24/22 1318     Visit Number 7    Number of Visits 21    Date for SLP Re-Evaluation 10/30/22    Authorization Type Medicaid    SLP Start Time 1317    SLP Stop Time  [id number]    SLP Time Calculation (min) 41 min    Activity Tolerance Patient tolerated treatment well               Past Medical History:  Diagnosis Date   Asthma    Intellectual disability    Past Surgical History:  Procedure Laterality Date   STRABISMUS SURGERY     WISDOM TOOTH EXTRACTION     Patient Active Problem List   Diagnosis Date Noted   Routine general medical examination at a health care facility 03/07/2021   Asthma 09/11/2018   Intellectual delay 09/11/2018   Hypocalcemia 09/11/2018    ONSET DATE: referral 08-14-22   REFERRING DIAG: F81.9 (ICD-10-CM) - Intellectual delay   THERAPY DIAG:  Expressive language disorder  Intellectual delay  Rationale for Evaluation and Treatment: Rehabilitation  SUBJECTIVE:   SUBJECTIVE STATEMENT: Pt and mother endorse communication is going well at home.   PERTINENT HISTORY: Per pt report, intellectual disability, reports ongoing difficulty getting words out.  PAIN:  Are you having pain? No  FALLS: Has patient fallen in last 6 months?  No  PATIENT GOALS: "Be able to complete a sentence without stopping." (About 40% of the time; Reach a 7/10 compared to 5/10 baseline for successful communication attempts).   OBJECTIVE:   TODAY'S TREATMENT:  09-24-22: SLP reviewed previously taught strategies to aid in improved communication. Pt able to ID "slow down" as intelligibility strategy. Ongoing education on pull out strategy when stutter-like dysfluencies occur. SLP led pt through  generative sentence level activity to facilitate practice with appropriate level of challenge to aid in generalization. Pt generates 3 sentences to describe photograph. SLP recorded pt to provide biofeedback and aid in self-evaluation. Pt rates 100% of trials as 4/5 using visual scale (5 being best score possible). Pt is 100% intelligible with use of strategies.   09-19-22: Targeted expressive language during picture description task. Pt demonstrated 95% intelligibility across task. SLP requested clarification twice, then pt restated, implementing previously taught speech intelligibility strategies. Occasional verbal cues to revisit strategy introduced last session. With min-A, pt able to reteach fluency strategy (pull out) to clinician.   09-17-22: [employer] requested signed trial agreement, pt's mother completed and SLP provided to [employer] rep.   Target improved fluency through introduction of pull out fluency strategy to aid pt when blocks or part word repetitions occur. Following direct education, SLP facilitates practice. At word level, pt able to demonstrate with occasional verbal cues in 10/10 trials, with SLP instructing for pseudo-stuttering. Given sound to stutter, pt ID area where tension is felt in 80% of trials. At sentence level, pt able to demonstrate use of strategy in 75% trials of true stuttering with occasional mod-A from SLP. Pt benefits from verbal cues to "relax and flow" to aid in completion. With use of dysarthria strategies and fluency strategies, pt 100% intelligible during structured language task.    PATIENT EDUCATION: Education details: see above Person educated: Patient and Parent Education method: Customer service manager  Education comprehension: verbalized understanding  GOALS: Goals reviewed with patient? Yes  SHORT TERM GOALS: Target date: 09-18-22  Pt will teach back and demonstrate speech fluency strategies and compensations with mod I. Baseline: extended  pauses and restarts, 75% of utterances Goal status: MET  2.  Pt will use multimodal communication to repair or support communication during structured task in 60% of opportunities given usual mod-A  Baseline:  usual communication breakdown (extended pauses, abandoned utterances) requiring A from mother to determine intended message Goal status: NOT MET  3.  Pt's communication partner will demonstrate use of supportive communication strategies with occasional min-A following direct instruction Baseline: mother reporting usual communication breakdowns between patient and caregivers  Goal status: MET  4.  Pt will use anomia compensation or strategy in 50% of opportunities with verbal cue from communication partner during 10 minute conversational sample Baseline: no strategies Goal status: MET   LONG TERM GOALS: Target date: 10-30-2022  Pt will employ multimodal communication to engage in 15 minute conversational exchange with no more than 3 uninterpreted messages (e.g. SLP unable to determine attempted message) Baseline:  usual communication breakdown (extended pauses, abandoned utterances) requiring A from mother to determine intended message Goal status: IN PROGRESS  2.  Pt will employ speech and fluency strategies PRN, given occasional min-A, in 50% of opportunities across a 45 minute session Baseline: no strategies Goal status: IN PROGRESS  3.  Pt's mother will report increase in subjective perception of communication efficacy via 10 point scale by d/c Baseline: 5/10 (see PROM section)  Goal status: IN PROGRESS  4.  Pt and caregivers will report carryover of supportive communication techniques, with benefit over 1 week period Baseline: mother reporting usual communication breakdowns between patient and caregivers  Goal status: IN PROGRESS   ASSESSMENT:  CLINICAL IMPRESSION: SLP continued educating pt on speech intelligibility strategies and compensations. SLP led patient through  heirarchical speech tasks to practice generalization of intelligibility strategies. Pt continues to benefit from skilled ST services to increase speech intelligibility.   OBJECTIVE IMPAIRMENTS: include expressive language. These impairments are limiting patient from effectively communicating at home and in community. Factors affecting potential to achieve goals and functional outcome are previous level of function.. Patient will benefit from skilled SLP services to address above impairments and improve overall function.  REHAB POTENTIAL: Fair Baseline developmental disability  PLAN:  SLP FREQUENCY: 1-2x/week (initiate at 2x/week, may decrease)  SLP DURATION: 10 weeks  PLANNED INTERVENTIONS: Multimodal communication approach, SLP instruction and feedback, Compensatory strategies, and Patient/family education  Check all possible CPT codes: [phone] - SLP treatment    Check all conditions that are expected to impact treatment: Cognitive impairment   If treatment provided at initial evaluation, no treatment charged due to lack of authorization.    Su Monks, [place] 09/24/2022, 1:58 PM

## 2022-09-24 NOTE — Patient Instructions (Signed)
You have had this speech for a very long time. We are working on how you can be a Animator in spite of the pauses and breaks in your speech.   Your speech is more clear when you are slow and steady.   We are practicing releasing tension to let your speech flow when it gets "stuck"  Here are some other things that help: Give yourself enough time to talk  Let other people know if you're not done talking (give index finger up single, "I'm not done", "I need a minute")  Practice talking slower (reading aloud is great practice!) to make it easier to do in conversation  The people you talk to can help by: Making sure to give extra time Ask "can you say that again slower"

## 2022-09-26 ENCOUNTER — Ambulatory Visit: Payer: Medicare HMO | Admitting: Speech Pathology

## 2022-09-26 DIAGNOSIS — F819 Developmental disorder of scholastic skills, unspecified: Secondary | ICD-10-CM | POA: Diagnosis not present

## 2022-09-26 DIAGNOSIS — F801 Expressive language disorder: Secondary | ICD-10-CM | POA: Diagnosis not present

## 2022-09-26 NOTE — Therapy (Addendum)
OUTPATIENT SPEECH LANGUAGE PATHOLOGY TREATMENT (DISCHARGE)   Patient Name: Kelly Washington MRN: [id number] DOB:09-Apr-1991, 32 y.o., female Today's Date: 09/26/2022  PCP: Myrlene Broker, MD REFERRING PROVIDER: Myrlene Broker, MD  END OF SESSION:  End of Session - 09/26/22 1400     Visit Number 8    Number of Visits 21    Date for SLP Re-Evaluation 10/30/22    Authorization Type Medicaid    SLP Start Time 1400    SLP Stop Time  1445    SLP Time Calculation (min) 45 min    Activity Tolerance Patient tolerated treatment well                Past Medical History:  Diagnosis Date   Asthma    Intellectual disability    Past Surgical History:  Procedure Laterality Date   STRABISMUS SURGERY     WISDOM TOOTH EXTRACTION     Patient Active Problem List   Diagnosis Date Noted   Routine general medical examination at a health care facility 03/07/2021   Asthma 09/11/2018   Intellectual delay 09/11/2018   Hypocalcemia 09/11/2018    ONSET DATE: referral 08-14-22   REFERRING DIAG: F81.9 (ICD-10-CM) - Intellectual delay   THERAPY DIAG:  Expressive language disorder  Intellectual delay  Rationale for Evaluation and Treatment: Rehabilitation  SUBJECTIVE:   SUBJECTIVE STATEMENT: Pt reports had conversation with dad, tells SLP it is getting easier to communicate with him,   PERTINENT HISTORY: Per pt report, intellectual disability, reports ongoing difficulty getting words out.  PAIN:  Are you having pain? No  FALLS: Has patient fallen in last 6 months?  No  PATIENT GOALS: "Be able to complete a sentence without stopping." (About 40% of the time; Reach a 7/10 compared to 5/10 baseline for successful communication attempts).   OBJECTIVE:   TODAY'S TREATMENT:  09-26-22: Pt able to teach back x2 strategies, SLP provided ongoing education on implementation of strategies and provided a visual aid to support generalization at home. Target employment of  strategies with multi-syllabic words and then in generative speech with SLP prompting description of each word. Pt with persisting blocks and occasional part-word repetition, though maintains high intelligibility (90%), requiring rare requests for repetition. SLP engaged pt in role play, ordering at restaurant, pt 100% intelligible.   09-24-22: SLP reviewed previously taught strategies to aid in improved communication. Pt able to ID "slow down" as intelligibility strategy. Ongoing education on pull out strategy when stutter-like dysfluencies occur. SLP led pt through generative sentence level activity to facilitate practice with appropriate level of challenge to aid in generalization. Pt generates 3 sentences to describe photograph. SLP recorded pt to provide biofeedback and aid in self-evaluation. Pt rates 100% of trials as 4/5 using visual scale (5 being best score possible). Pt is 100% intelligible with use of strategies.   09-19-22: Targeted expressive language during picture description task. Pt demonstrated 95% intelligibility across task. SLP requested clarification twice, then pt restated, implementing previously taught speech intelligibility strategies. Occasional verbal cues to revisit strategy introduced last session. With min-A, pt able to reteach fluency strategy (pull out) to clinician.   09-17-22: [employer] requested signed trial agreement, pt's mother completed and SLP provided to [employer] rep.   Target improved fluency through introduction of pull out fluency strategy to aid pt when blocks or part word repetitions occur. Following direct education, SLP facilitates practice. At word level, pt able to demonstrate with occasional verbal cues in 10/10 trials, with SLP instructing  for pseudo-stuttering. Given sound to stutter, pt ID area where tension is felt in 80% of trials. At sentence level, pt able to demonstrate use of strategy in 75% trials of true stuttering with occasional mod-A from SLP. Pt  benefits from verbal cues to "relax and flow" to aid in completion. With use of dysarthria strategies and fluency strategies, pt 100% intelligible during structured language task.    PATIENT EDUCATION: Education details: see above Person educated: Patient and Parent Education method: Medical illustrator Education comprehension: verbalized understanding  GOALS: Goals reviewed with patient? Yes  SHORT TERM GOALS: Target date: 09-18-22  Pt will teach back and demonstrate speech fluency strategies and compensations with mod I. Baseline: extended pauses and restarts, 75% of utterances Goal status: MET  2.  Pt will use multimodal communication to repair or support communication during structured task in 60% of opportunities given usual mod-A  Baseline:  usual communication breakdown (extended pauses, abandoned utterances) requiring A from mother to determine intended message Goal status: NOT MET  3.  Pt's communication partner will demonstrate use of supportive communication strategies with occasional min-A following direct instruction Baseline: mother reporting usual communication breakdowns between patient and caregivers  Goal status: MET  4.  Pt will use anomia compensation or strategy in 50% of opportunities with verbal cue from communication partner during 10 minute conversational sample Baseline: no strategies Goal status: MET   LONG TERM GOALS: Target date: 10-30-2022  Pt will employ multimodal communication to engage in 15 minute conversational exchange with no more than 3 uninterpreted messages (e.g. SLP unable to determine attempted message) Baseline:  usual communication breakdown (extended pauses, abandoned utterances) requiring A from mother to determine intended message Goal status: MET  2.  Pt will employ speech and fluency strategies PRN, given occasional min-A, in 50% of opportunities across a 45 minute session Baseline: no strategies Goal status: MET  3.   Pt's mother will report increase in subjective perception of communication efficacy via 10 point scale by d/c Baseline: 5/10 (see PROM section)  Goal status: MET  4.  Pt and caregivers will report carryover of supportive communication techniques, with benefit over 1 week period Baseline: mother reporting usual communication breakdowns between patient and caregivers  Goal status: MET   ASSESSMENT:  CLINICAL IMPRESSION: SLP continued educating pt on speech intelligibility strategies and compensations. SLP led patient through heirarchical speech tasks to practice generalization of intelligibility strategies. Pt requests d/c at this time, is pleased with current communication abilities.   OBJECTIVE IMPAIRMENTS: include expressive language. These impairments are limiting patient from effectively communicating at home and in community. Factors affecting potential to achieve goals and functional outcome are previous level of function.. Patient will benefit from skilled SLP services to address above impairments and improve overall function.  REHAB POTENTIAL: Fair Baseline developmental disability  PLAN:  SLP FREQUENCY: 1-2x/week (initiate at 2x/week, may decrease)  SLP DURATION: 10 weeks  PLANNED INTERVENTIONS: Multimodal communication approach, SLP instruction and feedback, Compensatory strategies, and Patient/family education  Check all possible CPT codes: 50932 - SLP treatment    Check all conditions that are expected to impact treatment: Cognitive impairment   If treatment provided at initial evaluation, no treatment charged due to lack of authorization.   SPEECH THERAPY DISCHARGE SUMMARY  Visits from Start of Care: 8  Current functional level related to goals / functional outcomes: Carryover of strategies and compensations reportedly resulting in improved communication with familiar communication partners. Pt endorses improved subjective perception of spoken communication  abilities.    Remaining deficits: dysarthria   Education / Equipment: Fluency strategies, dysarthria strategies and compensations, supportive communication techniques, HEP   Patient agrees to discharge. Patient goals were met. Patient is being discharged due to being pleased with the current functional level.      Maia Breslow, CCC-SLP 09/26/2022, 2:42 PM

## 2022-12-19 ENCOUNTER — Ambulatory Visit (INDEPENDENT_AMBULATORY_CARE_PROVIDER_SITE_OTHER): Payer: Medicare HMO

## 2022-12-19 VITALS — Ht 59.0 in | Wt 133.0 lb

## 2022-12-19 DIAGNOSIS — Z0000 Encounter for general adult medical examination without abnormal findings: Secondary | ICD-10-CM

## 2022-12-19 NOTE — Progress Notes (Signed)
I connected with  Kelly Washington on 12/19/22 by a audio enabled telemedicine application and verified that I am speaking with the correct person using two identifiers. Mother was also present on call  Patient Location: Home  Provider Location: Office/Clinic  I discussed the limitations of evaluation and management by telemedicine. The patient expressed understanding and agreed to proceed. Subjective:   Kelly Washington is a 32 y.o. female who presents for Medicare Annual (Subsequent) preventive examination.  Review of Systems     Cardiac Risk Factors include: advanced age (>42men, >35 women)     Objective:    Today's Vitals   12/19/22 1001  Weight: 133 lb (60.3 kg)  Height: 4\' 11"  (1.499 m)   Body mass index is 26.86 kg/m.     12/19/2022   10:05 AM 08/21/2022   11:00 AM 08/26/2021    5:52 PM  Advanced Directives  Does Patient Have a Medical Advance Directive? Yes Yes No  Type of Advance Directive Healthcare Power of [employer] Power of Attorney   Does patient want to make changes to medical advance directive?  No - Patient declined   Copy of Healthcare Power of Attorney in Chart? No - copy requested    Would patient like information on creating a medical advance directive?   No - Patient declined    Current Medications (verified) Outpatient Encounter Medications as of 12/19/2022  Medication Sig   albuterol (PROVENTIL) (2.5 MG/3ML) 0.083% nebulizer solution Take 3 mLs (2.5 mg total) by nebulization every 6 (six) hours as needed for wheezing or shortness of breath.   albuterol (PROVENTIL) (2.5 MG/3ML) 0.083% nebulizer solution Take 3 mLs (2.5 mg total) by nebulization every 6 (six) hours as needed for wheezing or shortness of breath.   albuterol (VENTOLIN HFA) 108 (90 Base) MCG/ACT inhaler Inhale 1-2 puffs into the lungs every 4 (four) hours as needed for wheezing or shortness of breath.   calcium-vitamin D (OSCAL WITH D) 250-125 MG-UNIT tablet Take 1 tablet by mouth  daily.   No facility-administered encounter medications on file as of 12/19/2022.    Allergies (verified) Patient has no known allergies.   History: Past Medical History:  Diagnosis Date   Asthma    Intellectual disability    Past Surgical History:  Procedure Laterality Date   STRABISMUS SURGERY     WISDOM TOOTH EXTRACTION     Family History  Problem Relation Age of Onset   Arthritis Mother    Cancer Mother    Hyperlipidemia Mother    Hypertension Mother    Miscarriages / [place] Mother    Arthritis Father    Diabetes Father    Depression Brother    Arthritis Maternal Grandmother    Cancer Maternal Grandmother    Heart disease Maternal Grandmother    Stroke Maternal Grandmother    Heart disease Maternal Grandfather    Arthritis Maternal Grandfather    Hearing loss Maternal Grandfather    Social History   Socioeconomic History   Marital status: Single    Spouse name: Not on file   Number of children: Not on file   Years of education: Not on file   Highest education level: Not on file  Occupational History   Not on file  Tobacco Use   Smoking status: Never   Smokeless tobacco: Never  Vaping Use   Vaping Use: Never used  Substance and Sexual Activity   Alcohol use: Never   Drug use: Never   Sexual activity: Not on  file  Other Topics Concern   Not on file  Social History Narrative   Not on file   Social Determinants of Health   Financial Resource Strain: Low Risk  (12/19/2022)   Overall Financial Resource Strain (CARDIA)    Difficulty of Paying Living Expenses: Not hard at all  Food Insecurity: No Food Insecurity (12/19/2022)   Hunger Vital Sign    Worried About Running Out of Food in the Last Year: Never true    Ran Out of Food in the Last Year: Never true  Transportation Needs: No Transportation Needs (12/19/2022)   PRAPARE - Administrator, Civil Service (Medical): No    Lack of Transportation (Non-Medical): No  Physical Activity:  Inactive (12/19/2022)   Exercise Vital Sign    Days of Exercise per Week: 0 days    Minutes of Exercise per Session: 0 min  Stress: No Stress Concern Present (12/19/2022)   Kelly Washington    Feeling of Stress : Not at all  Social Connections: Not on file    Tobacco Counseling Counseling given: Not Answered   Clinical Intake:  Pre-visit preparation completed: Yes  Pain : No/denies pain     Nutritional Status: BMI 25 -29 Overweight Nutritional Risks: None Diabetes: No  How often do you need to have someone help you when you read instructions, pamphlets, or other written materials from your doctor or pharmacy?: 3 - Sometimes  Diabetic? no  Interpreter Needed?: No  Information entered by :: NAllen LPN   Activities of Daily Living    12/19/2022   10:07 AM  In your present state of health, do you have any difficulty performing the following activities:  Hearing? 0  Vision? 0  Difficulty concentrating or making decisions? 0  Walking or climbing stairs? 0  Dressing or bathing? 0  Doing errands, shopping? 1  Preparing Food and eating ? Y  Using the Toilet? N  In the past six months, have you accidently leaked urine? N  Do you have problems with loss of bowel control? N  Managing your Medications? N  Managing your Finances? N  Housekeeping or managing your Housekeeping? N    Patient Care Team: Myrlene Broker, MD as PCP - General (Internal Medicine)  Indicate any recent Medical Services you may have received from other than [facility] providers in the past year (date may be approximate).     Assessment:   This is a routine wellness examination for [employer].  Hearing/Vision screen Vision Screening - Comments:: No regular eye exams  Dietary issues and exercise activities discussed: Current Exercise Habits: The patient does not participate in regular exercise at present   Goals Addressed              This Visit's Progress    Patient Stated       12/19/2022, no goals       Depression Screen    12/19/2022   10:06 AM 08/14/2022   10:30 AM 03/05/2021    2:16 PM  PHQ 2/9 Scores  PHQ - 2 Score 0 0 0  PHQ- 9 Score  0     Fall Risk    12/19/2022   10:06 AM 08/14/2022   10:30 AM  Fall Risk   Falls in the past year? 0 0  Number falls in past yr: 0 0  Injury with Fall? 0 0  Risk for fall due to : No Fall Risks   Follow up  Falls prevention discussed;Education provided;Falls evaluation completed Falls evaluation completed    FALL RISK PREVENTION PERTAINING TO THE HOME:  Any stairs in or around the home? Yes  If so, are there any without handrails? No  Home free of loose throw rugs in walkways, pet beds, electrical cords, etc? Yes  Adequate lighting in your home to reduce risk of falls? Yes   ASSISTIVE DEVICES UTILIZED TO PREVENT FALLS:  Life alert? No  Use of a cane, walker or w/c? No  Grab bars in the bathroom? No  Shower chair or bench in shower? No  Elevated toilet seat or a handicapped toilet? No   TIMED UP AND GO:  Was the test performed? No .      Cognitive Function:  6 CIT not administered. Patient has a diagnosis of intellectual delay.        Immunizations Immunization History  Administered Date(s) Administered   Influenza,inj,Quad PF,6+ Mos 04/09/2018, 03/06/2022    TDAP status: Due, Education has been provided regarding the importance of this vaccine. Advised may receive this vaccine at local pharmacy or Health Dept. Aware to provide a copy of the vaccination record if obtained from local pharmacy or Health Dept. Verbalized acceptance and understanding.  Flu Vaccine status: Up to date  Pneumococcal vaccine status: Up to date  Covid-19 vaccine status: Completed vaccines  Qualifies for Shingles Vaccine? No   Zostavax completed  n/a   Shingrix Completed?: n/a  Screening Tests Health Maintenance  Topic Date Due   COVID-19 Vaccine (1) Never done    HIV Screening  Never done   Hepatitis C Screening  Never done   DTaP/Tdap/Td (1 - Tdap) Never done   Medicare Annual Wellness (AWV)  03/05/2022   INFLUENZA VACCINE  02/13/2023   HPV VACCINES  Aged Out    Health Maintenance  Health Maintenance Due  Topic Date Due   COVID-19 Vaccine (1) Never done   HIV Screening  Never done   Hepatitis C Screening  Never done   DTaP/Tdap/Td (1 - Tdap) Never done   Medicare Annual Wellness (AWV)  03/05/2022    Colorectal cancer screening: n/a  Mammogram status: n/a  Bone Density status: n/a  Lung Cancer Screening: (Low Dose CT Chest recommended if Age 42-80 years, 30 pack-year currently smoking OR have quit w/in 15years.) does not qualify.   Lung Cancer Screening Referral: no  Additional Screening:  Hepatitis C Screening: does qualify;   Vision Screening: Recommended annual ophthalmology exams for early detection of glaucoma and other disorders of the eye. Is the patient up to date with their annual eye exam?  No  Who is the provider or what is the name of the office in which the patient attends annual eye exams? none If pt is not established with a provider, would they like to be referred to a provider to establish care? No .   Dental Screening: Recommended annual dental exams for proper oral hygiene  Community Resource Referral / Chronic Care Management: CRR required this visit?  No   CCM required this visit?  No      Plan:     I have personally reviewed and noted the following in the patient's chart:   Medical and social history Use of alcohol, tobacco or illicit drugs  Current medications and supplements including opioid prescriptions. Patient is not currently taking opioid prescriptions. Functional ability and status Nutritional status Physical activity Advanced directives List of other physicians Hospitalizations, surgeries, and ER visits in previous 12 months Vitals  Screenings to include cognitive, depression, and  falls Referrals and appointments  In addition, I have reviewed and discussed with patient certain preventive protocols, quality metrics, and best practice recommendations. A written personalized care plan for preventive services as well as general preventive health recommendations were provided to patient.     Barb Merino, LPN   07/20/1094   Nurse Notes: none  Due to this being a virtual visit, the after visit summary with patients personalized plan was offered to patient via mail or my-chart.  to pick up at office at next visit

## 2022-12-19 NOTE — Patient Instructions (Signed)
Kelly Washington , Thank you for taking time to come for your Medicare Wellness Visit. I appreciate your ongoing commitment to your health goals. Please review the following plan we discussed and let me know if I can assist you in the future.   These are the goals we discussed:  Goals      Patient Stated     12/19/2022, no goals        This is a list of the screening recommended for you and due dates:  Health Maintenance  Topic Date Due   COVID-19 Vaccine (1) Never done   HIV Screening  Never done   Hepatitis C Screening  Never done   DTaP/Tdap/Td vaccine (1 - Tdap) Never done   Flu Shot  02/13/2023   Medicare Annual Wellness Visit  12/19/2023   HPV Vaccine  Aged Out    Advanced directives: Please bring a copy of your POA (Power of [place]) and/or Living Will to your next appointment.   Conditions/risks identified: none  Next appointment: Follow up in one year for your annual wellness visit.   Preventive Care 80-18 Years Old, Female Preventive care refers to lifestyle choices and visits with your health care provider that can promote health and wellness. Preventive care visits are also called wellness exams. What can I expect for my preventive care visit? Counseling During your preventive care visit, your health care provider may ask about your: Medical history, including: Past medical problems. Family medical history. Pregnancy history. Current health, including: Menstrual cycle. Method of birth control. Emotional well-being. Home life and relationship well-being. Sexual activity and sexual health. Lifestyle, including: Alcohol, nicotine or tobacco, and drug use. Access to firearms. Diet, exercise, and sleep habits. Work and work Astronomer. Sunscreen use. Safety issues such as seatbelt and bike helmet use. Physical exam Your health care provider may check your: Height and weight. These may be used to calculate your BMI (body mass index). BMI is a measurement that  tells if you are at a healthy weight. Waist circumference. This measures the distance around your waistline. This measurement also tells if you are at a healthy weight and may help predict your risk of certain diseases, such as type 2 diabetes and high blood pressure. Heart rate and blood pressure. Body temperature. Skin for abnormal spots. What immunizations do I need? Vaccines are usually given at various ages, according to a schedule. Your health care provider will recommend vaccines for you based on your age, medical history, and lifestyle or other factors, such as travel or where you work. What tests do I need? Screening Your health care provider may recommend screening tests for certain conditions. This may include: Pelvic exam and Pap test. Lipid and cholesterol levels. Diabetes screening. This is done by checking your blood sugar (glucose) after you have not eaten for a while (fasting). Hepatitis B test. Hepatitis C test. HIV (human immunodeficiency virus) test. STI (sexually transmitted infection) testing, if you are at risk. BRCA-related cancer screening. This may be done if you have a family history of breast, ovarian, tubal, or peritoneal cancers. Talk with your health care provider about your test results, treatment options, and if necessary, the need for more tests. Follow these instructions at home: Eating and drinking  Eat a healthy diet that includes fresh fruits and vegetables, whole grains, lean protein, and low-fat dairy products. Take vitamin and mineral supplements as recommended by your health care provider. Do not drink alcohol if: Your health care provider tells you not to drink.  You are pregnant, may be pregnant, or are planning to become pregnant. If you drink alcohol: Limit how much you have to 0-1 drink a day. Know how much alcohol is in your drink. In the [place] one drink equals one 12 oz bottle of beer (355 mL), one 5 oz glass of wine (148 mL), or one 1 oz  glass of hard liquor (44 mL). Lifestyle Brush your teeth every morning and night with fluoride toothpaste. Floss one time each day. Exercise for at least 30 minutes 5 or more days each week. Do not use any products that contain nicotine or tobacco. These products include cigarettes, chewing tobacco, and vaping devices, such as e-cigarettes. If you need help quitting, ask your health care provider. Do not use drugs. If you are sexually active, practice safe sex. Use a condom or other form of protection to prevent STIs. If you do not wish to become pregnant, use a form of birth control. If you plan to become pregnant, see your health care provider for a prepregnancy visit. Find healthy ways to manage stress, such as: Meditation, yoga, or listening to music. Journaling. Talking to a trusted person. Spending time with friends and family. Minimize exposure to UV radiation to reduce your risk of skin cancer. Safety Always wear your seat belt while driving or riding in a vehicle. Do not drive: If you have been drinking alcohol. Do not ride with someone who has been drinking. If you have been using any mind-altering substances or drugs. While texting. When you are tired or distracted. Wear a helmet and other protective equipment during sports activities. If you have firearms in your house, make sure you follow all gun safety procedures. Seek help if you have been physically or sexually abused. What's next? Go to your health care provider once a year for an annual wellness visit. Ask your health care provider how often you should have your eyes and teeth checked. Stay up to date on all vaccines. This information is not intended to replace advice given to you by your health care provider. Make sure you discuss any questions you have with your health care provider. Document Revised: 12/27/2020 Document Reviewed: 12/27/2020 Elsevier Patient Education  2022 [employer].

## 2023-03-10 ENCOUNTER — Encounter: Payer: Self-pay | Admitting: Internal Medicine

## 2023-03-10 ENCOUNTER — Ambulatory Visit (INDEPENDENT_AMBULATORY_CARE_PROVIDER_SITE_OTHER): Payer: Medicaid Other | Admitting: Internal Medicine

## 2023-03-10 VITALS — BP 124/86 | HR 88 | Temp 98.2°F | Ht 59.0 in | Wt 141.0 lb

## 2023-03-10 DIAGNOSIS — J4520 Mild intermittent asthma, uncomplicated: Secondary | ICD-10-CM | POA: Diagnosis not present

## 2023-03-10 DIAGNOSIS — Z0000 Encounter for general adult medical examination without abnormal findings: Secondary | ICD-10-CM

## 2023-03-10 DIAGNOSIS — Z23 Encounter for immunization: Secondary | ICD-10-CM | POA: Diagnosis not present

## 2023-03-10 NOTE — Assessment & Plan Note (Signed)
Flu shot given. Tetanus up to date. Counseled about sun safety and mole surveillance. Counseled about the dangers of distracted driving. Given 10 year screening recommendations.

## 2023-03-10 NOTE — Assessment & Plan Note (Signed)
Taking tums daily and will check labs next year.

## 2023-03-10 NOTE — Progress Notes (Signed)
   Subjective:   Patient ID: Kelly Washington, female    DOB: January 31, 1991, 32 y.o.   MRN: [id number]  HPI The patient is here for physical.  PMH, [facility], social history reviewed and updated  Review of Systems  Constitutional: Negative.   HENT: Negative.    Eyes: Negative.   Respiratory:  Negative for cough, chest tightness and shortness of breath.   Cardiovascular:  Negative for chest pain, palpitations and leg swelling.  Gastrointestinal:  Negative for abdominal distention, abdominal pain, constipation, diarrhea, nausea and vomiting.  Musculoskeletal: Negative.   Skin: Negative.   Neurological: Negative.        Chronic speech changes with some word fluency issues  Psychiatric/Behavioral: Negative.      Objective:  Physical Exam Constitutional:      Appearance: She is well-developed.  HENT:     Head: Normocephalic and atraumatic.  Cardiovascular:     Rate and Rhythm: Normal rate and regular rhythm.  Pulmonary:     Effort: Pulmonary effort is normal. No respiratory distress.     Breath sounds: Normal breath sounds. No wheezing or rales.  Abdominal:     General: Bowel sounds are normal. There is no distension.     Palpations: Abdomen is soft.     Tenderness: There is no abdominal tenderness. There is no rebound.  Musculoskeletal:     Cervical back: Normal range of motion.  Skin:    General: Skin is warm and dry.  Neurological:     Mental Status: She is alert and oriented to person, place, and time.     Coordination: Coordination normal.     Vitals:   03/10/23 1022  BP: 124/86  Pulse: 88  Temp: 98.2 F (36.8 C)  TempSrc: Oral  SpO2: 99%  Weight: 141 lb (64 kg)  Height: 4\' 11"  (1.499 m)    Assessment & Plan:  Flu shot given at visit

## 2023-03-10 NOTE — Assessment & Plan Note (Signed)
No flare and mild intermittent. Has albuterol uses mostly with fall/spring. Advised to increase exercise to help and can use albuterol before exercise as needed.

## 2023-12-25 ENCOUNTER — Ambulatory Visit: Payer: Medicare HMO

## 2023-12-25 VITALS — Ht 59.0 in | Wt 141.0 lb

## 2023-12-25 DIAGNOSIS — Z0000 Encounter for general adult medical examination without abnormal findings: Secondary | ICD-10-CM | POA: Diagnosis not present

## 2023-12-25 NOTE — Patient Instructions (Signed)
 Ms. Kelly Washington , Thank you for taking time out of your busy schedule to complete your Annual Wellness Visit with me. I enjoyed our conversation and look forward to speaking with you again next year. I, as well as your care team,  appreciate your ongoing commitment to your health goals. Please review the following plan we discussed and let me know if I can assist you in the future. Your Game plan/ To Do List    Follow up Visits: Next Medicare AWV with our clinical staff: 12/30/2024.   Have you seen your provider in the last 6 months (3 months if uncontrolled diabetes)? No Next Office Visit with your provider: 03/11/2024.  Clinician Recommendations:  Aim for 30 minutes of exercise or brisk walking, 6-8 glasses of water, and 5 servings of fruits and vegetables each day. You are due for a tetanus and a pneumonia vaccine.      This is a list of the screening recommended for you and due dates:  Health Maintenance  Topic Date Due   HPV Vaccine (1 - 3-dose series) Never done   HIV Screening  Never done   Hepatitis C Screening  Never done   DTaP/Tdap/Td vaccine (1 - Tdap) Never done   Pneumococcal Vaccination (1 of 2 - PCV) Never done   COVID-19 Vaccine (1 - 2024-25 season) Never done   Flu Shot  02/13/2024   Medicare Annual Wellness Visit  12/24/2024   Meningitis B Vaccine  Aged Out    Advanced directives: (Copy Requested) Please bring a copy of your health care power of attorney and living will to the office to be added to your chart at your convenience. You can mail to [facility] [address] [place], [place] [address] or email to [email or web address] Advance Care Planning is important because it:  [x]  Makes sure you receive the medical care that is consistent with your values, goals, and preferences  [x]  It provides guidance to your family and loved ones and reduces their decisional burden about whether or not they are making the right decisions based on your  wishes.  Follow the link provided in your after visit summary or read over the paperwork we have mailed to you to help you started getting your Advance Directives in place. If you need assistance in completing these, please reach out to us  so that we can help you!  See attachments for Preventive Care and Fall Prevention Tips.

## 2023-12-25 NOTE — Progress Notes (Signed)
 Subjective:   Kelly Washington is a 33 y.o. who presents for a Medicare Wellness preventive visit.  As a reminder, Annual Wellness Visits don't include a physical exam, and some assessments may be limited, especially if this visit is performed virtually. We may recommend an in-person follow-up visit with your provider if needed.  Visit Complete: Virtual I connected with  Kelly Washington on 12/25/23 by a audio enabled telemedicine application and verified that I am speaking with the correct person using two identifiers.  Patient Location: Home  Provider Location: Office/Clinic  I discussed the limitations of evaluation and management by telemedicine. The patient expressed understanding and agreed to proceed.  Vital Signs: Because this visit was a virtual/telehealth visit, some criteria may be missing or patient reported. Any vitals not documented were not able to be obtained and vitals that have been documented are patient reported.  VideoDeclined- This patient declined Librarian, academic. Therefore the visit was completed with audio only.  Persons Participating in Visit: Mother of patient and patient was present during visit.  AWV Questionnaire: No: Patient Medicare AWV questionnaire was not completed prior to this visit.  Cardiac Risk Factors include: advanced age (>38men, >20 women) \ PHQ9 NOT DONE TODAY DUE TO PATIENT'S CONDITION.  6CIT score  NOT DONE TODAY DUE TO PATIENT'S CONDITION.    Objective:    Today's Vitals   12/25/23 0929  Weight: 141 lb (64 kg)  Height: 4' 11 (1.499 m)   Body mass index is 28.48 kg/m.     12/25/2023    9:43 AM 12/19/2022   10:05 AM 08/21/2022   11:00 AM 08/26/2021    5:52 PM  Advanced Directives  Does Patient Have a Medical Advance Directive? Yes Yes Yes No  Type of Estate agent of [place];Living will Healthcare Power of [employer] Power of Attorney   Does patient want to make  changes to medical advance directive?   No - Patient declined   Copy of Healthcare Power of Attorney in Chart? No - copy requested No - copy requested    Would patient like information on creating a medical advance directive?    No - Patient declined    Current Medications (verified) Outpatient Encounter Medications as of 12/25/2023  Medication Sig   albuterol  (PROVENTIL ) (2.5 MG/3ML) 0.083% nebulizer solution Take 3 mLs (2.5 mg total) by nebulization every 6 (six) hours as needed for wheezing or shortness of breath.   albuterol  (VENTOLIN  HFA) 108 (90 Base) MCG/ACT inhaler Inhale 1-2 puffs into the lungs every 4 (four) hours as needed for wheezing or shortness of breath.   calcium carbonate (TUMS - DOSED IN MG ELEMENTAL CALCIUM) 500 MG chewable tablet Chew 3 tablets by mouth daily.   albuterol  (PROVENTIL ) (2.5 MG/3ML) 0.083% nebulizer solution Take 3 mLs (2.5 mg total) by nebulization every 6 (six) hours as needed for wheezing or shortness of breath.   calcium-vitamin D (OSCAL WITH D) 250-125 MG-UNIT tablet Take 1 tablet by mouth daily. (Patient not taking: Reported on 12/25/2023)   No facility-administered encounter medications on file as of 12/25/2023.    Allergies (verified) Patient has no known allergies.   History: Past Medical History:  Diagnosis Date   Asthma    Intellectual disability    Past Surgical History:  Procedure Laterality Date   STRABISMUS SURGERY     WISDOM TOOTH EXTRACTION     Family History  Problem Relation Age of Onset   Arthritis Mother  Cancer Mother    Hyperlipidemia Mother    Hypertension Mother    Miscarriages / [place] Mother    Arthritis Father    Diabetes Father    Depression Brother    Arthritis Maternal Grandmother    Cancer Maternal Grandmother    Heart disease Maternal Grandmother    Stroke Maternal Grandmother    Heart disease Maternal Grandfather    Arthritis Maternal Grandfather    Hearing loss Maternal Grandfather    Social  History   Socioeconomic History   Marital status: Single    Spouse name: Not on file   Number of children: Not on file   Years of education: Not on file   Highest education level: Not on file  Occupational History   Not on file  Tobacco Use   Smoking status: Never   Smokeless tobacco: Never  Vaping Use   Vaping status: Never Used  Substance and Sexual Activity   Alcohol use: Never   Drug use: Never   Sexual activity: Not on file  Other Topics Concern   Not on file  Social History Narrative   Lives with her parents/2025   Social Drivers of Health   Financial Resource Strain: Low Risk  (12/25/2023)   Overall Financial Resource Strain (CARDIA)    Difficulty of Paying Living Expenses: Not hard at all  Food Insecurity: No Food Insecurity (12/25/2023)   Hunger Vital Sign    Worried About Running Out of Food in the Last Year: Never true    Ran Out of Food in the Last Year: Never true  Transportation Needs: No Transportation Needs (12/25/2023)   PRAPARE - Administrator, Civil Service (Medical): No    Lack of Transportation (Non-Medical): No  Physical Activity: Inactive (12/25/2023)   Exercise Vital Sign    Days of Exercise per Week: 0 days    Minutes of Exercise per Session: 0 min  Stress: No Stress Concern Present (12/19/2022)   [employer] of Occupational Health - Occupational Stress Questionnaire    Feeling of Stress : Not at all  Social Connections: Moderately Isolated (12/25/2023)   Social Connection and Isolation Panel    Frequency of Communication with Friends and Family: Twice a week    Frequency of Social Gatherings with Friends and Family: Once a week    Attends Religious Services: 1 to 4 times per year    Active Member of [employer] or Organizations: No    Attends Engineer, structural: Never    Marital Status: Never married    Tobacco Counseling Counseling given: Not Answered    Clinical Intake:  Pre-visit preparation completed:  Yes  Pain : No/denies pain     BMI - recorded: 28.48 Nutritional Status: BMI 25 -29 Overweight Nutritional Risks: None Diabetes: No  No results found for: HGBA1C   How often do you need to have someone help you when you read instructions, pamphlets, or other written materials from your doctor or pharmacy?: 5 - Always  Interpreter Needed?: No  Comments: Mother of patient is answering questions for patient as she is her legal guardian Information entered by :: Yitzchok Carriger, RMA   Activities of Daily Living     12/25/2023    9:39 AM  In your present state of health, do you have any difficulty performing the following activities:  Hearing? 0  Vision? 0  Difficulty concentrating or making decisions? 1  Walking or climbing stairs? 0  Dressing or bathing? 0  Doing errands,  shopping? 0  Comment mom drives for her  Preparing Food and eating ? N  Using the Toilet? N  In the past six months, have you accidently leaked urine? N  Do you have problems with loss of bowel control? N  Managing your Medications? Y  Comment mother helps  Managing your Finances? Y  Housekeeping or managing your Housekeeping? Y    Patient Care Team: Adelia Homestead, MD as PCP - General (Internal Medicine)  I have updated your Care Teams any recent Medical Services you may have received from other providers in the past year.     Assessment:   This is a routine wellness examination for [employer].  Hearing/Vision screen Hearing Screening - Comments:: Denies hearing difficulties   Vision Screening - Comments:: Wears eyeglasses   Goals Addressed   None    Depression Screen PHQ9 NOT DONE TODAY DUE TO PATIENT'S CONDITION    12/25/2023    9:45 AM 12/19/2022   10:06 AM 08/14/2022   10:30 AM 03/05/2021    2:16 PM  PHQ 2/9 Scores  PHQ - 2 Score 0 0 0 0  PHQ- 9 Score   0   Exception Documentation Other- indicate reason in comment box     Not completed Mother answered questions for patient        Fall Risk     12/25/2023    9:43 AM 03/10/2023   10:24 AM 12/19/2022   10:06 AM 08/14/2022   10:30 AM  Fall Risk   Falls in the past year? 0 0 0 0  Number falls in past yr: 0 0 0 0  Injury with Fall? 0 0 0 0  Risk for fall due to :   No Fall Risks   Follow up Falls evaluation completed;Falls prevention discussed Falls evaluation completed Falls prevention discussed;Education provided;Falls evaluation completed Falls evaluation completed    MEDICARE RISK AT HOME:  Medicare Risk at Home Any stairs in or around the home?: Yes (3 outside in front) If so, are there any without handrails?: No Home free of loose throw rugs in walkways, pet beds, electrical cords, etc?: Yes Adequate lighting in your home to reduce risk of falls?: Yes Life alert?: No Use of a cane, walker or w/c?: No Grab bars in the bathroom?: No Shower chair or bench in shower?: No Elevated toilet seat or a handicapped toilet?: Yes  TIMED UP AND GO:  Was the test performed?  No  Cognitive Function: Impaired: Patient has current diagnosis of cognitive impairment.        Immunizations Immunization History  Administered Date(s) Administered   Influenza, Seasonal, Injecte, Preservative Fre 03/10/2023   Influenza,inj,Quad PF,6+ Mos 04/09/2018, 03/06/2022    Screening Tests Health Maintenance  Topic Date Due   HPV VACCINES (1 - 3-dose series) Never done   HIV Screening  Never done   Hepatitis C Screening  Never done   DTaP/Tdap/Td (1 - Tdap) Never done   Pneumococcal Vaccine 33-76 Years old (1 of 2 - PCV) Never done   COVID-19 Vaccine (1 - 2024-25 season) Never done   INFLUENZA VACCINE  02/13/2024   Medicare Annual Wellness (AWV)  12/24/2024   Meningococcal B Vaccine  Aged Out    Health Maintenance  Health Maintenance Due  Topic Date Due   HPV VACCINES (1 - 3-dose series) Never done   HIV Screening  Never done   Hepatitis C Screening  Never done   DTaP/Tdap/Td (1 - Tdap) Never done   Pneumococcal  Vaccine 72-10 Years old (1 of 2 - PCV) Never done   COVID-19 Vaccine (1 - 2024-25 season) Never done   Health Maintenance Items Addressed: See Nurse Notes at the end of this note, HIV and Hep C screening due.   Additional Screening:  Vision Screening: Recommended annual ophthalmology exams for early detection of glaucoma and other disorders of the eye. Would you like a referral to an eye doctor? Yes    Dental Screening: Recommended annual dental exams for proper oral hygiene  Community Resource Referral / Chronic Care Management: CRR required this visit?  No   CCM required this visit?  No   Plan:    I have personally reviewed and noted the following in the patient's chart:   Medical and social history Use of alcohol, tobacco or illicit drugs  Current medications and supplements including opioid prescriptions. Patient is not currently taking opioid prescriptions. Functional ability and status Nutritional status Physical activity Advanced directives List of other physicians Hospitalizations, surgeries, and ER visits in previous 12 months Vitals Screenings to include cognitive, depression, and falls Referrals and appointments  In addition, I have reviewed and discussed with patient certain preventive protocols, quality metrics, and best practice recommendations. A written personalized care plan for preventive services as well as general preventive health recommendations were provided to patient.   Ismaeel Arvelo L Jovanni Rash, CMA   12/25/2023   After Visit Summary: (Mail) Due to this being a telephonic visit, the after visit summary with patients personalized plan was offered to patient via mail   Notes: Visit questions answered by patient's mother/legal guardian.  Patient is due for a tetanus vaccine, HPV and pneumonia vaccine.  She is also in need of a ophthalmologist.

## 2024-01-15 ENCOUNTER — Telehealth: Payer: Self-pay | Admitting: Internal Medicine

## 2024-01-15 NOTE — Telephone Encounter (Signed)
 Copied from CRM [id number]. Topic: Clinical - Medical Advice >> Jan 15, 2024 10:09 AM Thersia BROCKS wrote: Reason for CRM: [facility] called in regarding patient, stated they need most recent notes on patient  Fax number  [phone]

## 2024-01-21 NOTE — Telephone Encounter (Signed)
 I have sent over recent office notes to [facility] eye care

## 2024-01-23 DIAGNOSIS — H35033 Hypertensive retinopathy, bilateral: Secondary | ICD-10-CM | POA: Diagnosis not present

## 2024-01-23 DIAGNOSIS — H25013 Cortical age-related cataract, bilateral: Secondary | ICD-10-CM | POA: Diagnosis not present

## 2024-01-23 DIAGNOSIS — H5213 Myopia, bilateral: Secondary | ICD-10-CM | POA: Diagnosis not present

## 2024-01-23 DIAGNOSIS — Z9889 Other specified postprocedural states: Secondary | ICD-10-CM | POA: Diagnosis not present

## 2024-02-24 ENCOUNTER — Encounter: Admitting: Internal Medicine

## 2024-03-11 ENCOUNTER — Encounter: Payer: Medicare HMO | Admitting: Internal Medicine

## 2024-04-06 ENCOUNTER — Ambulatory Visit: Admitting: Internal Medicine

## 2024-04-06 ENCOUNTER — Encounter: Payer: Self-pay | Admitting: Internal Medicine

## 2024-04-06 VITALS — BP 136/88 | HR 107 | Temp 98.5°F | Resp 18 | Ht 59.0 in | Wt 152.8 lb

## 2024-04-06 DIAGNOSIS — Z23 Encounter for immunization: Secondary | ICD-10-CM

## 2024-04-06 DIAGNOSIS — Z0000 Encounter for general adult medical examination without abnormal findings: Secondary | ICD-10-CM

## 2024-04-06 DIAGNOSIS — J4520 Mild intermittent asthma, uncomplicated: Secondary | ICD-10-CM

## 2024-04-06 DIAGNOSIS — F819 Developmental disorder of scholastic skills, unspecified: Secondary | ICD-10-CM

## 2024-04-06 LAB — CBC
HCT: 42.6 % (ref 36.0–46.0)
Hemoglobin: 14.1 g/dL (ref 12.0–15.0)
MCHC: 33 g/dL (ref 30.0–36.0)
MCV: 86.5 fl (ref 78.0–100.0)
Platelets: 171 K/uL (ref 150.0–400.0)
RBC: 4.92 Mil/uL (ref 3.87–5.11)
RDW: 14.2 % (ref 11.5–15.5)
WBC: 4.7 K/uL (ref 4.0–10.5)

## 2024-04-06 LAB — COMPREHENSIVE METABOLIC PANEL WITH GFR
ALT: 16 U/L (ref 0–35)
AST: 12 U/L (ref 0–37)
Albumin: 4.7 g/dL (ref 3.5–5.2)
Alkaline Phosphatase: 48 U/L (ref 39–117)
BUN: 9 mg/dL (ref 6–23)
CO2: 27 meq/L (ref 19–32)
Calcium: 9.6 mg/dL (ref 8.4–10.5)
Chloride: 102 meq/L (ref 96–112)
Creatinine, Ser: 0.82 mg/dL (ref 0.40–1.20)
GFR: 93.96 mL/min (ref >60.00)
Glucose, Bld: 94 mg/dL (ref 70–99)
Potassium: 3.8 meq/L (ref 3.5–5.1)
Sodium: 139 meq/L (ref 135–145)
Total Bilirubin: 0.5 mg/dL (ref 0.2–1.2)
Total Protein: 7.1 g/dL (ref 6.0–8.3)

## 2024-04-06 LAB — LIPID PANEL
Cholesterol: 242 mg/dL — ABNORMAL HIGH (ref 0–200)
HDL: 40.1 mg/dL (ref >39.00)
LDL Cholesterol: 177 mg/dL — ABNORMAL HIGH (ref 0–99)
NonHDL: 201.92
Total CHOL/HDL Ratio: 6
Triglycerides: 123 mg/dL (ref 0.0–149.0)
VLDL: 24.6 mg/dL (ref 0.0–40.0)

## 2024-04-06 LAB — HEMOGLOBIN A1C: Hgb A1c MFr Bld: 6.6 % — ABNORMAL HIGH (ref 4.6–6.5)

## 2024-04-06 NOTE — Assessment & Plan Note (Signed)
 Checking CMP and adjust as needed.

## 2024-04-06 NOTE — Assessment & Plan Note (Signed)
 No flare today and mild intermittent. Uses albuterol  nebulizer prn. Continue.

## 2024-04-06 NOTE — Assessment & Plan Note (Signed)
 Struggling some with emotional regulation. Enjoys puzzles and does this regularly. Encouraged more physical activity. Offered counseling to help her work on skills and she will think about this.

## 2024-04-06 NOTE — Progress Notes (Signed)
   Subjective:   Patient ID: Kelly Washington, female    DOB: August 16, 1990, 33 y.o.   MRN: [id number]  The patient is here for physical. Pertinent topics discussed: Discussed the use of AI scribe software for clinical note transcription with the patient, who gave verbal consent to proceed.  History of Present Illness Kelly Washington is a 33 year old female with asthma who presents for a routine follow-up.  She uses a nebulizer as needed and has not been using it more frequently than usual. Occasional episodes occur, particularly during the fall, but are described as manageable.  Her mother expresses concern about weight gain over the past several years and the potential risk of diabetes, given the family history of diabetes in her father and aunt. She has been consuming sugary drinks like Cokes, which may contribute to the weight issue.  She experiences mood fluctuations, which have been occurring more frequently lately. She feels overwhelmed at times, especially when around family members for extended periods. During these episodes, she tends to yell but eventually calms down. Her mother notes that she has been spending a lot of time together, which might contribute to these mood changes.  No chest pain, stomach issues, changes in bowel habits, or urinary problems. No new headaches, vision or hearing problems, muscle or joint pain, and skin changes. She continues to see the dentist regularly and does not smoke.  PMH, [facility], social history reviewed and updated  Review of Systems  Constitutional: Negative.   HENT: Negative.    Eyes: Negative.   Respiratory:  Negative for cough, chest tightness and shortness of breath.   Cardiovascular:  Negative for chest pain, palpitations and leg swelling.  Gastrointestinal:  Negative for abdominal distention, abdominal pain, constipation, diarrhea, nausea and vomiting.  Musculoskeletal: Negative.   Skin: Negative.   Neurological: Negative.    Psychiatric/Behavioral: Negative.      Objective:  Physical Exam Constitutional:      Appearance: She is well-developed.  HENT:     Head: Normocephalic and atraumatic.  Cardiovascular:     Rate and Rhythm: Normal rate and regular rhythm.  Pulmonary:     Effort: Pulmonary effort is normal. No respiratory distress.     Breath sounds: Normal breath sounds. No wheezing or rales.  Abdominal:     General: Bowel sounds are normal. There is no distension.     Palpations: Abdomen is soft.     Tenderness: There is no abdominal tenderness.  Musculoskeletal:     Cervical back: Normal range of motion.  Skin:    General: Skin is warm and dry.  Neurological:     Mental Status: She is alert and oriented to person, place, and time.     Coordination: Coordination normal.     Vitals:   04/06/24 1018  BP: 136/88  Pulse: (!) 107  Resp: 18  Temp: 98.5 F (36.9 C)  SpO2: 97%  Weight: 152 lb 12.8 oz (69.3 kg)  Height: 4' 11 (1.499 m)    Assessment & Plan:   Flu shot given at visit

## 2024-04-06 NOTE — Assessment & Plan Note (Signed)
 Flu shot given. Tetanus up to date. pap smear not indicated. Counseled about sun safety and mole surveillance. Counseled about the dangers of distracted driving. Given 10 year screening recommendations.

## 2024-04-12 ENCOUNTER — Ambulatory Visit: Payer: Self-pay | Admitting: Internal Medicine

## 2024-12-30 ENCOUNTER — Ambulatory Visit

## 2025-04-08 ENCOUNTER — Encounter: Admitting: Internal Medicine
